# Patient Record
Sex: Female | Born: 1960 | Race: Black or African American | Hispanic: No | Marital: Single | Smoking: Current some day smoker
Health system: Southern US, Community
[De-identification: ages and names within clinical notes are randomized; demographics above are authoritative.]

## PROBLEM LIST (undated history)

## (undated) DIAGNOSIS — J40 Bronchitis, not specified as acute or chronic: Secondary | ICD-10-CM

## (undated) HISTORY — PX: FRACTURE SURGERY: SHX138

---

## 1998-04-06 HISTORY — PX: ANKLE SURGERY: SHX546

## 2003-12-15 ENCOUNTER — Emergency Department (HOSPITAL_COMMUNITY): Admission: EM | Admit: 2003-12-15 | Discharge: 2003-12-15 | Payer: Self-pay | Admitting: Emergency Medicine

## 2009-05-09 ENCOUNTER — Emergency Department: Payer: Self-pay | Admitting: Emergency Medicine

## 2013-04-06 HISTORY — PX: CYST EXCISION: SHX5701

## 2013-11-09 ENCOUNTER — Observation Stay: Payer: Self-pay | Admitting: Obstetrics and Gynecology

## 2013-11-09 LAB — COMPREHENSIVE METABOLIC PANEL
ALBUMIN: 3.4 g/dL (ref 3.4–5.0)
ANION GAP: 10 (ref 7–16)
AST: 10 U/L — AB (ref 15–37)
Alkaline Phosphatase: 53 U/L
BILIRUBIN TOTAL: 0.2 mg/dL (ref 0.2–1.0)
BUN: 7 mg/dL (ref 7–18)
CHLORIDE: 104 mmol/L (ref 98–107)
Calcium, Total: 8.3 mg/dL — ABNORMAL LOW (ref 8.5–10.1)
Co2: 24 mmol/L (ref 21–32)
Creatinine: 1.04 mg/dL (ref 0.60–1.30)
EGFR (Non-African Amer.): >60
GLUCOSE: 117 mg/dL — AB (ref 65–99)
Osmolality: 275 (ref 275–301)
Potassium: 4.1 mmol/L (ref 3.5–5.1)
SGPT (ALT): 13 U/L — ABNORMAL LOW
Sodium: 138 mmol/L (ref 136–145)
TOTAL PROTEIN: 7.7 g/dL (ref 6.4–8.2)

## 2013-11-09 LAB — CBC
HCT: 18.7 % — AB (ref 35.0–47.0)
HCT: 20 % — ABNORMAL LOW (ref 35.0–47.0)
HGB: 4.9 g/dL — CL (ref 12.0–16.0)
HGB: 5.7 g/dL — ABNORMAL LOW (ref 12.0–16.0)
MCH: 15.5 pg — ABNORMAL LOW (ref 26.0–34.0)
MCH: 18.2 pg — ABNORMAL LOW (ref 26.0–34.0)
MCHC: 26 g/dL — ABNORMAL LOW (ref 32.0–36.0)
MCHC: 28.4 g/dL — AB (ref 32.0–36.0)
MCV: 60 fL — AB (ref 80–100)
MCV: 64 fL — ABNORMAL LOW (ref 80–100)
Platelet: 1147 10*3/uL — ABNORMAL HIGH (ref 150–440)
Platelet: 950 10*3/uL — ABNORMAL HIGH (ref 150–440)
RBC: 3.13 10*6/uL — ABNORMAL LOW (ref 3.80–5.20)
RBC: 3.11 10*6/uL — AB (ref 3.80–5.20)
RDW: 31.4 % — AB (ref 11.5–14.5)
RDW: 35 % — ABNORMAL HIGH (ref 11.5–14.5)
WBC: 11.3 10*3/uL — AB (ref 3.6–11.0)
WBC: 11.4 10*3/uL — AB (ref 3.6–11.0)

## 2013-11-09 LAB — URINALYSIS, COMPLETE
BACTERIA: NONE SEEN
BILIRUBIN, UR: NEGATIVE
Glucose,UR: NEGATIVE mg/dL (ref 0–75)
KETONE: NEGATIVE
Nitrite: NEGATIVE
Ph: 5 (ref 4.5–8.0)
RBC,UR: 169 /HPF (ref 0–5)
SPECIFIC GRAVITY: 1.018 (ref 1.003–1.030)
WBC UR: 39 /HPF (ref 0–5)

## 2013-11-09 LAB — PREGNANCY, URINE: Pregnancy Test, Urine: NEGATIVE m[IU]/mL

## 2013-11-09 LAB — TROPONIN I: Troponin-I: <0.02 ng/mL

## 2013-11-10 LAB — CBC WITH DIFFERENTIAL/PLATELET
BASOS ABS: 0.2 10*3/uL — AB (ref 0.0–0.1)
BASOS PCT: 1.4 %
Eosinophil #: 0.5 10*3/uL (ref 0.0–0.7)
Eosinophil %: 3.5 %
HCT: 25.5 % — AB (ref 35.0–47.0)
HGB: 8 g/dL — ABNORMAL LOW (ref 12.0–16.0)
Lymphocyte #: 1.4 10*3/uL (ref 1.0–3.6)
Lymphocyte %: 9.7 %
MCH: 21.5 pg — AB (ref 26.0–34.0)
MCHC: 31.4 g/dL — ABNORMAL LOW (ref 32.0–36.0)
MCV: 68 fL — ABNORMAL LOW (ref 80–100)
MONO ABS: 0.6 x10 3/mm (ref 0.2–0.9)
Monocyte %: 4.4 %
Neutrophil #: 11.9 10*3/uL — ABNORMAL HIGH (ref 1.4–6.5)
Neutrophil %: 81 %
Platelet: 756 10*3/uL — ABNORMAL HIGH (ref 150–440)
RBC: 3.73 10*6/uL — AB (ref 3.80–5.20)
RDW: 33.6 % — ABNORMAL HIGH (ref 11.5–14.5)
WBC: 14.7 10*3/uL — AB (ref 3.6–11.0)

## 2013-11-10 LAB — HEMOGLOBIN: HGB: 8.1 g/dL — ABNORMAL LOW (ref 12.0–16.0)

## 2013-11-10 LAB — HEMATOCRIT: HCT: 26.1 % — ABNORMAL LOW (ref 35.0–47.0)

## 2013-11-11 LAB — DRUG SCREEN, URINE
AMPHETAMINES, UR SCREEN: NEGATIVE (ref ?–1000)
Barbiturates, Ur Screen: NEGATIVE (ref ?–200)
Benzodiazepine, Ur Scrn: NEGATIVE (ref ?–200)
COCAINE METABOLITE, UR ~~LOC~~: NEGATIVE (ref ?–300)
Cannabinoid 50 Ng, Ur ~~LOC~~: NEGATIVE (ref ?–50)
MDMA (Ecstasy)Ur Screen: NEGATIVE (ref ?–500)
Methadone, Ur Screen: NEGATIVE (ref ?–300)
Opiate, Ur Screen: POSITIVE (ref ?–300)
Phencyclidine (PCP) Ur S: NEGATIVE (ref ?–25)
Tricyclic, Ur Screen: NEGATIVE (ref ?–1000)

## 2013-11-11 LAB — CBC WITH DIFFERENTIAL/PLATELET
BASOS PCT: 1.5 %
Basophil #: 0.2 10*3/uL — ABNORMAL HIGH (ref 0.0–0.1)
EOS ABS: 0.6 10*3/uL (ref 0.0–0.7)
Eosinophil %: 4.3 %
HCT: 25.9 % — ABNORMAL LOW (ref 35.0–47.0)
HGB: 8.2 g/dL — ABNORMAL LOW (ref 12.0–16.0)
LYMPHS ABS: 2.3 10*3/uL (ref 1.0–3.6)
Lymphocyte %: 16.2 %
MCH: 21.5 pg — AB (ref 26.0–34.0)
MCHC: 31.7 g/dL — ABNORMAL LOW (ref 32.0–36.0)
MCV: 68 fL — ABNORMAL LOW (ref 80–100)
MONO ABS: 0.6 x10 3/mm (ref 0.2–0.9)
MONOS PCT: 4.5 %
NEUTROS ABS: 10.6 10*3/uL — AB (ref 1.4–6.5)
NEUTROS PCT: 73.5 %
Platelet: 735 10*3/uL — ABNORMAL HIGH (ref 150–440)
RBC: 3.8 10*6/uL (ref 3.80–5.20)
RDW: 34.9 % — AB (ref 11.5–14.5)
WBC: 14.4 10*3/uL — AB (ref 3.6–11.0)

## 2013-11-14 LAB — PATHOLOGY REPORT

## 2014-06-11 ENCOUNTER — Ambulatory Visit: Payer: Self-pay | Admitting: Physician Assistant

## 2014-07-28 NOTE — Op Note (Signed)
PATIENT NAME:  Cindy Hicks, Cindy Hicks MR#:  [id number] DATE OF BIRTH:  [birth year]  DATE OF PROCEDURE:  11/11/2013  PREOPERATIVE DIAGNOSES: 1. Prolapsing submucosal leiomyoma.  2. Menorrhagia to anemia.   POSTOPERATIVE DIAGNOSES: 1. Prolapsing submucosal leiomyoma.  2. Menorrhagia to anemia.   OPERATION PERFORMED: Vaginal myomectomy.   ANESTHESIA: General.   PRIMARY SURGEON: Stoney Bang. Georgianne Fick, M.D.   ASSISTANT: Will Bonnet, M.D.    ESTIMATED BLOOD LOSS: Minimal.   OPERATIVE FLUIDS: 4 mL of crystalloid.   COMPLICATIONS: None.   FINDINGS: Approximately 3 to 4 cm smooth submucosal leiomyoma prolapsing through the cervix on a thin stalk.   SPECIMENS REMOVED: Submucosal leiomyoma.   PATIENT CONDITION FOLLOWING PROCEDURE: Stable.   PREOPERATIVE ANTIBIOTICS: None.   DRAINS OR TUBES: None.   IMPLANTS: None.   PROCEDURE IN DETAIL: The risks, benefits, and alternatives of the procedure were discussed with the patient prior to proceeding to the operating room. The patient was taken to the operating room where she was placed under general endotracheal anesthesia. She was positioned in the dorsal lithotomy position, prepped and draped in the usual sterile fashion. A timeout was performed.   Attention was turned to the patient's bladder. The bladder was straight catheterized with a red rubber catheter. An operative speculum was then placed, visualizing the cervix and the prolapsing leiomyoma. The leiomyoma was grasped with a double-tooth tenaculum. Two Endoloops of polydioxanone suture were then passed over the myoma to tie off the stalk. A Heaney clamp was then placed across the stalk and the myoma was excised using Mayo scissors. Following excision of the myoma, the cervix was inspected and noted to be hemostatic with no bleeding noted from the cervical os or site of dissection.   Sponge, needle, and instrument counts were correct x 2.   The patient tolerated the procedure well and  was taken to the recovery room in stable condition.     ____________________________ Stoney Bang. Georgianne Fick, MD ams:JT D: 11/16/2013 11:34:37 ET T: 11/16/2013 12:17:46 ET JOB#: [id number]  cc: Stoney Bang. Georgianne Fick, MD, <Dictator> Conan Bowens Madelon Lips MD ELECTRONICALLY SIGNED 11/27/2013 15:56

## 2014-07-28 NOTE — Consult Note (Signed)
PATIENT NAME:  Cindy Hicks, Cindy Hicks MR#:  [id number] DATE OF BIRTH:  [birth year]  DATE OF CONSULTATION:  11/09/2013  REQUESTING PHYSICIAN:  Brunilda Payor A. Edd Fabian, [place] PHYSICIAN:  Will Bonnet, MD  REASON FOR CONSULTATION:  Anemia due to heavy vaginal bleeding.   HISTORY OF PRESENT ILLNESS: Cindy Hicks is a 54 year old gravida 0 female who presented to acute care today after having 3 weeks of heavy vaginal bleeding with worsening symptoms of weakness and feeling tired. She states that her menses started approximately 1 month ago and usually the 1st 3 or 4 days are very bad, where she cannot leave the house due to having to change her pad every 28-30 minutes.  She has had abnormal periods for the past 6 months, and though they come at regular intervals, her bleeding is very heavy and lasting about 12 days.  Intercourse seems to make the bleeding worse. She occasionally does have dyspareunia with intercourse. She also has to wear a tampon before each period due to a large amount of clear discharge. She notes that she will occasionally get short of breath with exertion and she also notices insomnia. She denies chest pain, trouble breathing, abdominal pain or abdominal bloating, change in her weight.  The patient's last Pap smear was approximately 3 years ago, which she notes that it was probably abnormal, but she does not remember exactly what the result was.   PAST MEDICAL HISTORY: Chronic bronchitis.   PAST SURGICAL HISTORY: Ankle surgery on her left ankle for and ankle fracture.   CURRENT MEDICATIONS: None.   ALLERGIES: No known drug allergies.   OB/GYN HISTORY: Menarche at age 77 years old with periods as above. She has never been pregnant.   SOCIAL HISTORY: She states that she smokes approximately 5 cigarettes per day. She drinks approximately 6 crantinis a day, which is a vodka containing concoction that she makes.  She also occasionally will smoke marijuana.  FAMILY HISTORY:   Notable for diabetes. She denies any history of cancer in her family.   REVIEW OF SYSTEMS:  Normal x10 systems reviewed unless otherwise noted in the HPI.   PHYSICAL EXAMINATION: VITAL SIGNS: Temperature 98.2 degrees Fahrenheit, pulse 92, respiratory rate 16, blood pressure 126/66, oxygen saturation 97% on room air.  GENERAL: Obese female who is in no apparent distress.  NECK: No lymphadenopathy. Trachea is midline.  No thyromegaly.  CARDIOVASCULAR: Regular rate and rhythm with a 2/6 systolic ejection murmur.  LUNGS:  Clear to auscultation bilaterally.  ABDOMEN: Obese soft, nontender, nondistended. No masses palpated. No rebound.  No guarding.  PELVIC:  The patient has normal external female genitalia, normal Bartholin's urethra and Skene's glands. The vagina is pink and normally rugated. The cervix and is not visible due to approximately 4 cm of prolapsing fibroid which is well-circumscribed. There is scant dark blood in the vaginal vault. Bimanual exam confirms the presence of a prolapsing mass coming from the cervix.  The cervix, itself, is very difficult to palpate. This is nontender to the patient. There are no obvious uterine abnormalities or masses palpated.  No adnexal fullness or tenderness.  However, exam is a very limited given the patient's habitus.  EXTREMITIES: No erythema, cords or tenderness.  SKIN: No lesions noted. No petechiae, no ecchymoses.  NEUROLOGIC: Cranial nerves are intact grossly.  LABORATORY DATA:  Pertinents only:  Hemoglobin 4.9, hematocrit is 18.7, white blood cell count is 11.3. Platelet count is 1147. MCV is 60. Serum glucose is 117, creatinine 1.04, potassium  4.1. GFR greater than 60. LFTs normal.  Pregnancy test is negative.   IMAGING: Pelvic ultrasound revealed uterus measuring 8.6 x 5.0 x 5.1 cm. The urine fibroids noted appearing peripheral/subserosal. At least 2 uterine fibroids demonstrated.  Fundal fibroid is 3.7 x 2.7 x 3.7 cm. The 2nd fibroid is  approximately 2.6 x 2.4 x 2.5 cm. Endometrium 2.6 mm in thickness with no focal abnormality visualized. Right ovary measures 3.1 x 3.0 x 2.7 cm with a small minimally complex hypoechoic cyst noted that is measuring 2.2 x 1.5 x 1.3 cm, possibly representing a hemorrhagic cyst.  Left ovary measuring 2.1 x 1.2 x 1.4 cm with no appearance and no adnexal mass. No free fluid noted in the cul-de-sac.  This is all per radiology report.   ASSESSMENT AND RECOMMENDATIONS: This is a 54 year old G38 female who is profoundly anemic due to vaginal bleeding, most likely due to her prolapsing fibroid, appears stable now. The patient has received 1 unit of packed red blood cells and has normal vitals  at this time and no symptoms. She is very concerned regarding her continued bleeding.   PLAN:   1.  Admit for observation and transfusion of additional blood products as necessary.  2.  For her heavy vaginal bleeding, will give her Provera 20 mg tablets 3 times daily per the ACOG practice bulletin recommendation.  3.  Will keep n.p.o. for now with only taking the above-noted medications in case she does need to go to the OR.  4.  Thrombocytosis. Will obtain a hematology consult while the patient is in-house to see if this relates at all to any bleeding diathesis the patient may have.  5.  Plan for stabilizing bleeding and discharge tomorrow with early followup and plan for surgical removal of fibroid.   Thank you for this consultation. Appreciate the opportunity to participate in the care of your patient.    ____________________________ Will Bonnet, MD sdj:ds D: 11/09/2013 19:22:57 ET T: 11/09/2013 20:01:44 ET JOB#: [id number]  cc: Will Bonnet, MD, <Dictator> Will Bonnet MD ELECTRONICALLY SIGNED 12/20/2013 18:07

## 2014-07-28 NOTE — Consult Note (Signed)
Brief Consult Note: Diagnosis: 1) acute blood loss anemia due to menstrual blood loss, 2) fibroid of cervix, 3) fibroid uterus, 4) thrombocytosis.   Patient was seen by consultant.   Consult note dictated.   Recommend further assessment or treatment.   Orders entered.   Discussed with Attending MD.   Comments: -Will admit for observation and medication administration to help with bleeding -if needs to go to OR, will remove cervical fibroid, but would like to perform under more controlled circumstances with more normal blood counts on outpatient basis. - hematology consult for thrombocytosis.  Electronic Signatures: Will Bonnet (MD)  (Signed 06-Aug-15 18:31)  Authored: Brief Consult Note   Last Updated: 06-Aug-15 18:31 by Will Bonnet (MD)

## 2015-09-10 ENCOUNTER — Emergency Department
Admission: EM | Admit: 2015-09-10 | Discharge: 2015-09-11 | Disposition: A | Discharge status: Home / Self Care | Payer: 59 | Attending: Emergency Medicine | Admitting: Emergency Medicine

## 2015-09-10 ENCOUNTER — Emergency Department: Payer: 59

## 2015-09-10 ENCOUNTER — Encounter: Payer: Self-pay | Admitting: Emergency Medicine

## 2015-09-10 DIAGNOSIS — Y939 Activity, unspecified: Secondary | ICD-10-CM | POA: Insufficient documentation

## 2015-09-10 DIAGNOSIS — W010XXA Fall on same level from slipping, tripping and stumbling without subsequent striking against object, initial encounter: Secondary | ICD-10-CM | POA: Diagnosis not present

## 2015-09-10 DIAGNOSIS — Y92009 Unspecified place in unspecified non-institutional (private) residence as the place of occurrence of the external cause: Secondary | ICD-10-CM | POA: Insufficient documentation

## 2015-09-10 DIAGNOSIS — S8991XA Unspecified injury of right lower leg, initial encounter: Secondary | ICD-10-CM | POA: Diagnosis present

## 2015-09-10 DIAGNOSIS — Z87891 Personal history of nicotine dependence: Secondary | ICD-10-CM | POA: Diagnosis not present

## 2015-09-10 DIAGNOSIS — S8261XA Displaced fracture of lateral malleolus of right fibula, initial encounter for closed fracture: Secondary | ICD-10-CM | POA: Diagnosis not present

## 2015-09-10 DIAGNOSIS — S82402A Unspecified fracture of shaft of left fibula, initial encounter for closed fracture: Secondary | ICD-10-CM

## 2015-09-10 DIAGNOSIS — S8251XA Displaced fracture of medial malleolus of right tibia, initial encounter for closed fracture: Secondary | ICD-10-CM | POA: Diagnosis not present

## 2015-09-10 DIAGNOSIS — Y999 Unspecified external cause status: Secondary | ICD-10-CM | POA: Diagnosis not present

## 2015-09-10 DIAGNOSIS — S82202A Unspecified fracture of shaft of left tibia, initial encounter for closed fracture: Secondary | ICD-10-CM

## 2015-09-10 MED ORDER — MORPHINE SULFATE (PF) 4 MG/ML IV SOLN
4.0000 mg | Freq: Once | INTRAVENOUS | Status: AC
  Administered 2015-09-11: 4 mg via INTRAVENOUS
  Filled 2015-09-10: qty 1

## 2015-09-10 MED ORDER — ONDANSETRON HCL 4 MG/2ML IJ SOLN
4.0000 mg | Freq: Once | INTRAMUSCULAR | Status: AC
  Administered 2015-09-11: 4 mg via INTRAVENOUS
  Filled 2015-09-10: qty 2

## 2015-09-10 NOTE — ED Notes (Signed)
Pt arrived by EMS from home, post fall with c/o right ankle pain. EMS reports pt fell coming out of house, pt fell back sitting on the right ankle when landing. Swelling noted to right ankle on arrival to ED.

## 2015-09-11 ENCOUNTER — Encounter: Payer: Self-pay | Admitting: Emergency Medicine

## 2015-09-11 DIAGNOSIS — S8251XA Displaced fracture of medial malleolus of right tibia, initial encounter for closed fracture: Secondary | ICD-10-CM | POA: Diagnosis not present

## 2015-09-11 MED ORDER — OXYCODONE-ACETAMINOPHEN 5-325 MG PO TABS: 1.0000 | ORAL_TABLET | Freq: Four times a day (QID) | ORAL | Status: DC | PRN

## 2015-09-11 MED ORDER — MORPHINE SULFATE (PF) 4 MG/ML IV SOLN
4.0000 mg | Freq: Once | INTRAVENOUS | Status: AC
  Administered 2015-09-11: 4 mg via INTRAVENOUS
  Filled 2015-09-11: qty 1

## 2015-09-11 MED ORDER — HYDROMORPHONE HCL 1 MG/ML IJ SOLN
1.0000 mg | Freq: Once | INTRAMUSCULAR | Status: AC
  Administered 2015-09-11: 1 mg via INTRAVENOUS
  Filled 2015-09-11: qty 1

## 2015-09-11 MED ORDER — OXYCODONE-ACETAMINOPHEN 5-325 MG PO TABS
1.0000 | ORAL_TABLET | Freq: Once | ORAL | Status: AC
  Administered 2015-09-11: 1 via ORAL
  Filled 2015-09-11: qty 1

## 2015-09-11 NOTE — Discharge Instructions (Signed)
Tibial and Fibular Fracture, Adult °Tibial and fibular fracture is a break in the bones of your lower leg (tibia and fibula). The tibia is the larger of these two bones. The fibula is the smaller of the two bones. It is on the outer side of your leg.  °CAUSES °· Low-energy injuries, such as a fall from ground level. °· High-energy injuries, such as motor vehicle injuries, gunshot wounds, or high-speed sports collisions. °RISK FACTORS °· Jumping activities. °· Repetitive stress, such as long-distance running. °· Participation in sports. °· Osteoporosis. °· Advanced age. °SIGNS AND SYMPTOMS °· Pain. °· Swelling. °· Inability to put weight on your injured leg. °· Bone deformities at the site of your injury. °· Bruising. °DIAGNOSIS  °Tibial and fibular fractures are diagnosed with the use of X-ray exams. °TREATMENT  °If you have a simple fracture of these two bones, they can be treated with simple immobilization. A cast or splint will be used on your leg to keep it from moving while it heals. Then you can begin range-of-motion exercises to regain your knee motion. °HOME CARE INSTRUCTIONS  °· Apply ice to your leg: °¨ Put ice in a plastic bag. °¨ Place a towel between your skin and the bag. °¨ Leave the ice on for 20 minutes, 2-3 times a day. °· If you have a plaster or fiberglass cast: °¨ Do not try to scratch the skin under the cast using sharp or pointed objects. °¨ Check the skin around the cast every day. You may put lotion on any red or sore areas. °¨ Keep your cast dry and clean. °· If you have a plaster splint: °¨ Wear the splint as directed. °¨ You may loosen the elastic around the splint if your toes become numb, tingle, or turn cold or blue. °· Do not put pressure on any part of your cast or splint until it is fully hardened, because it may deform. °· Your cast or splint can be protected during bathing with a plastic bag. Do not lower the cast or splint into water. °· Use crutches as directed. °· Only take  over-the-counter or prescription medicines for pain, discomfort, or fever as directed by your health care provider. °· Follow all instructions given to you by your health care provider. °· Make and keep all follow-up appointments. °SEEK MEDICAL CARE IF: °· Your pain is becoming worse rather than better or is not controlled with medicines. °· You have increased swelling or redness in the foot. °· You begin to lose feeling in your foot or toes. °SEEK IMMEDIATE MEDICAL CARE IF: °· You develop a cold or blue foot or toes on the injured side. °· You develop severe pain in your injured leg, especially if the pain is increased with movement of your toes. °MAKE SURE YOU: °· Understand these instructions. °· Will watch your condition. °· Will get help right away if you are not doing well or get worse. °  °This information is not intended to replace advice given to you by your health care provider. Make sure you discuss any questions you have with your health care provider. °  °Document Released: 12/13/2001 Document Revised: 08/07/2014 Document Reviewed: 11/02/2012 °Elsevier Interactive Patient Education ©2016 [employer] ° °

## 2015-09-11 NOTE — ED Provider Notes (Signed)
[facility] Emergency Department Provider Note   ____________________________________________  Time seen: Approximately 2352 PM  I have reviewed the triage vital signs and the nursing notes.   HISTORY  Chief Complaint Ankle Pain    HPI Cindy Hicks is a 55 y.o. female who comes into the hospital today with an ankle injury. The patient reports that she fell out of the back door. She stumbled and slipped back and sat on her right foot. The patient denies any head injury or any loss of consciousness. She reports that she's in 10 out of 10 pain. She reports that she can move her toes but she did not put ice on it or do anything. She simply called back into the house at on the couch and then EMS was called. She reports that she's had left ankle surgery and 2000 done by Dr. Jefm Bryant but she has not injured this ankle in the past.The patient is here for evaluation of her ankle pain.   History reviewed. No pertinent past medical history.  There are no active problems to display for this patient.   Past Surgical History  Procedure Laterality Date  . Ankle surgery      Current Outpatient Rx  Name  Route  Sig  Dispense  Refill  . oxyCODONE-acetaminophen (ROXICET) 5-325 MG tablet   Oral   Take 1 tablet by mouth every 6 (six) hours as needed.   12 tablet   0     Allergies Review of patient's allergies indicates no known allergies.  History reviewed. No pertinent family history.  Social History Social History  Substance Use Topics  . Smoking status: Former Smoker    Types: Cigarettes  . Smokeless tobacco: None  . Alcohol Use: Yes    Review of Systems Constitutional: No fever/chills Eyes: No visual changes. ENT: No sore throat. Cardiovascular: Denies chest pain. Respiratory: Denies shortness of breath. Gastrointestinal: No abdominal pain.  No nausea, no vomiting.  No diarrhea.  No constipation. Genitourinary: Negative for  dysuria. Musculoskeletal: right ankle pain Skin: Negative for rash. Neurological: Negative for headaches, focal weakness or numbness.  10-point ROS otherwise negative.  ____________________________________________   PHYSICAL EXAM:  VITAL SIGNS: ED Triage Vitals  Enc Vitals Group     BP 09/10/15 2320 168/95 mmHg     Pulse Rate 09/10/15 2320 85     Resp 09/10/15 2320 15     Temp 09/10/15 2320 98.1 F (36.7 C)     Temp Source 09/10/15 2320 Oral     SpO2 09/10/15 2319 99 %     Weight 09/10/15 2320 200 lb (90.719 kg)     Height 09/10/15 2320 5\' 3"  (1.6 m)     Head Cir --      Peak Flow --      Pain Score 09/10/15 2321 10     Pain Loc --      Pain Edu? --      Excl. in [place]? --     Constitutional: Alert and oriented. Well appearing and in moderate distress. Eyes: Conjunctivae are normal. PERRL. EOMI. Head: Atraumatic. Nose: No congestion/rhinnorhea. Mouth/Throat: Mucous membranes are moist.  Oropharynx non-erythematous. Cardiovascular: Normal rate, regular rhythm. Grossly normal heart sounds.  Good peripheral circulation. Respiratory: Normal respiratory effort.  No retractions. Lungs CTAB. Gastrointestinal: Soft and nontender. No distention. Positive bowel sounds Musculoskeletal: right ankle swelling and tenderness to palpation.  Neurologic:  Normal speech and language. No gross focal neurologic deficits are appreciated. No gait instability.  Skin:  Skin is warm, dry and intact.  Psychiatric: Mood and affect are normal.   ____________________________________________   LABS (all labs ordered are listed, but only abnormal results are displayed)  Labs Reviewed - No data to display ____________________________________________  EKG  none ____________________________________________  RADIOLOGY  Ankle Complete right: Fractures of the distal fibula and medial malleolus. A fracture fragment along the posterior aspect of distal tibia on the lateral projection likely  represents a fracture of the posterior malleolus. Ct may provide better evaluation of the ankle ____________________________________________   PROCEDURES  Procedure(s) performed: please, see procedure note(s).   SPLINT APPLICATION Date/Time: 123XX123 AM Authorized by: Charlesetta Ivory P Consent: Verbal consent obtained. Risks and benefits: risks, benefits and alternatives were discussed Consent given by: patient Splint applied by: ED technician Location details: right ankle Splint type: posterior and sugar tong Supplies used: orthoglass Post-procedure: The splinted body part was neurovascularly unchanged following the procedure. Patient tolerance: Patient tolerated the procedure well with no immediate complications.    Critical Care performed: No  ____________________________________________   INITIAL IMPRESSION / ASSESSMENT AND PLAN / ED COURSE  Pertinent labs & imaging results that were available during my care of the patient were reviewed by me and considered in my medical decision making (see chart for details).  This is a 55 year old female who comes into the hospital with an ankle injury. It appears that the patient has a bimalleolar or try mallear fracture. I contacted Dr.Krasinski who looked at the images and felt that the patient could be casted in the office. The patient received 2 doses of morphine and was still having some pains so I did give her a milligram of Dilaudid and the pain improved. I also gave the patient a dose of Percocet. I discussed the results of her x-ray as well as my conversation with the orthopedic surgeon. The patient will be discharged and he has informed her to contact the office this morning so that she can come in and have her fractures casted. The patient will be discharged home to follow-up with orthopedic surgery. ____________________________________________   FINAL CLINICAL IMPRESSION(S) / ED DIAGNOSES  Final diagnoses:  Fracture, tibia and  fibula, left, closed, initial encounter      NEW MEDICATIONS STARTED DURING THIS VISIT:  Discharge Medication List as of 09/11/2015  3:03 AM    START taking these medications   Details  oxyCODONE-acetaminophen (ROXICET) 5-325 MG tablet Take 1 tablet by mouth every 6 (six) hours as needed., Starting 09/11/2015, Until Discontinued, Print         Note:  This document was prepared using Dragon voice recognition software and may include unintentional dictation errors.    Loney Hering, MD 09/11/15 0800

## 2015-09-13 ENCOUNTER — Other Ambulatory Visit: Payer: Self-pay | Admitting: Orthopedic Surgery

## 2015-09-17 ENCOUNTER — Encounter
Admission: RE | Admit: 2015-09-17 | Discharge: 2015-09-17 | Disposition: A | Discharge status: Home / Self Care | Payer: No Typology Code available for payment source | Source: Ambulatory Visit | Attending: Orthopedic Surgery | Admitting: Orthopedic Surgery

## 2015-09-17 DIAGNOSIS — Z01812 Encounter for preprocedural laboratory examination: Secondary | ICD-10-CM | POA: Insufficient documentation

## 2015-09-17 HISTORY — DX: Bronchitis, not specified as acute or chronic: J40

## 2015-09-17 LAB — CBC WITH DIFFERENTIAL/PLATELET
BASOS PCT: 1 %
Basophils Absolute: 0 10*3/uL (ref 0–0.1)
EOS ABS: 0.3 10*3/uL (ref 0–0.7)
EOS PCT: 7 %
HCT: 44 % (ref 35.0–47.0)
Hemoglobin: 14.4 g/dL (ref 12.0–16.0)
LYMPHS ABS: 1.1 10*3/uL (ref 1.0–3.6)
Lymphocytes Relative: 22 %
MCH: 28.1 pg (ref 26.0–34.0)
MCHC: 32.6 g/dL (ref 32.0–36.0)
MCV: 86.3 fL (ref 80.0–100.0)
MONO ABS: 0.3 10*3/uL (ref 0.2–0.9)
MONOS PCT: 7 %
NEUTROS PCT: 63 %
Neutro Abs: 3.2 10*3/uL (ref 1.4–6.5)
Platelets: 309 10*3/uL (ref 150–440)
RBC: 5.1 MIL/uL (ref 3.80–5.20)
RDW: 14.4 % (ref 11.5–14.5)
WBC: 5 10*3/uL (ref 3.6–11.0)

## 2015-09-17 LAB — BASIC METABOLIC PANEL
Anion gap: 10 (ref 5–15)
BUN: 15 mg/dL (ref 6–20)
CALCIUM: 9.5 mg/dL (ref 8.9–10.3)
CHLORIDE: 97 mmol/L — AB (ref 101–111)
CO2: 27 mmol/L (ref 22–32)
CREATININE: 0.9 mg/dL (ref 0.44–1.00)
GFR calc Af Amer: >60 mL/min (ref >60)
GFR calc non Af Amer: >60 mL/min (ref >60)
GLUCOSE: 100 mg/dL — AB (ref 65–99)
Potassium: 4 mmol/L (ref 3.5–5.1)
Sodium: 134 mmol/L — ABNORMAL LOW (ref 135–145)

## 2015-09-17 LAB — PROTIME-INR
INR: 1.03
Prothrombin Time: 13.7 seconds (ref 11.4–15.0)

## 2015-09-17 LAB — APTT: aPTT: 36 seconds (ref 24–36)

## 2015-09-17 NOTE — Patient Instructions (Signed)
  Your procedure is scheduled [id number] 14, 2017 (Wednesday) Report to Day Surgery.SECOND FLOOR  MEDICAL [facility]ALL To find out your arrival time please call [phone] between 1PM - 3PM on ARRIVAL TIME 12:00 Noon.  Remember: Instructions that are not followed completely may result in serious medical risk, up to and including death, or upon the discretion of your surgeon and anesthesiologist your surgery may need to be rescheduled.    _x___ 1. Do not eat food or drink liquids after midnight. No gum chewing or hard candies.     _x__ 2. No Alcohol for 24 hours before or after surgery.   _x___ 3. Do Not Smoke For 24 Hours Prior to Your Surgery.   ____ 4. Bring all medications with you on the day of surgery if instructed.    __x__ 5. Notify your doctor if there is any change in your medical condition     (cold, fever, infections).       Do not wear jewelry, make-up, hairpins, clips or nail polish.  Do not wear lotions, powders, or perfumes. You may wear deodorant.  Do not shave 48 hours prior to surgery. Men may shave face and neck.  Do not bring valuables to the hospital.    [facility] is not responsible for any belongings or valuables.               Contacts, dentures or bridgework may not be worn into surgery.  Leave your suitcase in the car. After surgery it may be brought to your room.  For patients admitted to the hospital, discharge time is determined by your                treatment team.   Patients discharged the day of surgery will not be allowed to drive home.   Please read over the following fact sheets that you were given:   Surgical Site Infection Prevention   ____ Take these medicines the morning of surgery with A SIP OF WATER:    1.   2.   3.   4.  5.  6.  ____ Fleet Enema (as directed)   __x__ Use CHG Soap as directed  (SAGE WIPES)  ____ Use inhalers on the day of surgery  ____ Stop metformin 2 days prior to surgery    ____ Take 1/2 of usual insulin dose  the night before surgery and none on the morning of surgery.   _x___ Stop Coumadin/Plavix/aspirin on (N/A)  _x_ Stop Anti-inflammatories on (NO NSAIDS)   ____ Stop supplements until after surgery.    ____ Bring C-Pap to the hospital.

## 2015-09-18 ENCOUNTER — Ambulatory Visit: Payer: No Typology Code available for payment source | Admitting: Certified Registered Nurse Anesthetist

## 2015-09-18 ENCOUNTER — Encounter: Payer: Self-pay | Admitting: *Deleted

## 2015-09-18 ENCOUNTER — Ambulatory Visit: Payer: No Typology Code available for payment source

## 2015-09-18 ENCOUNTER — Encounter
Admission: RE | Disposition: A | Discharge status: Home / Self Care | Payer: Self-pay | Source: Ambulatory Visit | Attending: Orthopedic Surgery

## 2015-09-18 ENCOUNTER — Observation Stay
Admission: RE | Admit: 2015-09-18 | Discharge: 2015-09-19 | Disposition: A | Discharge status: Home / Self Care | Payer: No Typology Code available for payment source | Source: Ambulatory Visit | Attending: Orthopedic Surgery | Admitting: Orthopedic Surgery

## 2015-09-18 DIAGNOSIS — Y939 Activity, unspecified: Secondary | ICD-10-CM | POA: Insufficient documentation

## 2015-09-18 DIAGNOSIS — Z885 Allergy status to narcotic agent status: Secondary | ICD-10-CM | POA: Diagnosis not present

## 2015-09-18 DIAGNOSIS — Y92008 Other place in unspecified non-institutional (private) residence as the place of occurrence of the external cause: Secondary | ICD-10-CM | POA: Diagnosis not present

## 2015-09-18 DIAGNOSIS — S82852A Displaced trimalleolar fracture of left lower leg, initial encounter for closed fracture: Principal | ICD-10-CM | POA: Insufficient documentation

## 2015-09-18 DIAGNOSIS — S82851A Displaced trimalleolar fracture of right lower leg, initial encounter for closed fracture: Secondary | ICD-10-CM | POA: Diagnosis present

## 2015-09-18 DIAGNOSIS — S82853A Displaced trimalleolar fracture of unspecified lower leg, initial encounter for closed fracture: Secondary | ICD-10-CM | POA: Diagnosis present

## 2015-09-18 DIAGNOSIS — F17210 Nicotine dependence, cigarettes, uncomplicated: Secondary | ICD-10-CM | POA: Insufficient documentation

## 2015-09-18 DIAGNOSIS — W19XXXA Unspecified fall, initial encounter: Secondary | ICD-10-CM | POA: Diagnosis not present

## 2015-09-18 DIAGNOSIS — Z79899 Other long term (current) drug therapy: Secondary | ICD-10-CM | POA: Insufficient documentation

## 2015-09-18 DIAGNOSIS — R531 Weakness: Secondary | ICD-10-CM | POA: Diagnosis not present

## 2015-09-18 DIAGNOSIS — Z7982 Long term (current) use of aspirin: Secondary | ICD-10-CM | POA: Insufficient documentation

## 2015-09-18 DIAGNOSIS — R262 Difficulty in walking, not elsewhere classified: Secondary | ICD-10-CM | POA: Insufficient documentation

## 2015-09-18 DIAGNOSIS — M25571 Pain in right ankle and joints of right foot: Secondary | ICD-10-CM | POA: Insufficient documentation

## 2015-09-18 DIAGNOSIS — R269 Unspecified abnormalities of gait and mobility: Secondary | ICD-10-CM | POA: Diagnosis not present

## 2015-09-18 HISTORY — PX: ORIF ANKLE FRACTURE: SHX5408

## 2015-09-18 LAB — CBC
HCT: 42.1 % (ref 35.0–47.0)
Hemoglobin: 13.6 g/dL (ref 12.0–16.0)
MCH: 28.3 pg (ref 26.0–34.0)
MCHC: 32.2 g/dL (ref 32.0–36.0)
MCV: 88 fL (ref 80.0–100.0)
PLATELETS: 288 10*3/uL (ref 150–440)
RBC: 4.78 MIL/uL (ref 3.80–5.20)
RDW: 14.3 % (ref 11.5–14.5)
WBC: 11.6 10*3/uL — AB (ref 3.6–11.0)

## 2015-09-18 LAB — CREATININE, SERUM
CREATININE: 1.04 mg/dL — AB (ref 0.44–1.00)
GFR calc non Af Amer: >60 mL/min (ref >60)

## 2015-09-18 LAB — POCT PREGNANCY, URINE: Preg Test, Ur: NEGATIVE

## 2015-09-18 SURGERY — OPEN REDUCTION INTERNAL FIXATION (ORIF) ANKLE FRACTURE
Anesthesia: General | Site: Ankle | Laterality: Right | Wound class: Clean

## 2015-09-18 MED ORDER — ONDANSETRON HCL 4 MG/2ML IJ SOLN
4.0000 mg | Freq: Four times a day (QID) | INTRAMUSCULAR | Status: DC | PRN
  Administered 2015-09-18: 4 mg via INTRAVENOUS
  Filled 2015-09-18 (×2): qty 2

## 2015-09-18 MED ORDER — KETOROLAC TROMETHAMINE 15 MG/ML IJ SOLN
15.0000 mg | Freq: Four times a day (QID) | INTRAMUSCULAR | Status: DC
  Administered 2015-09-18 – 2015-09-19 (×3): 15 mg via INTRAVENOUS
  Filled 2015-09-18 (×11): qty 1

## 2015-09-18 MED ORDER — NEOMYCIN-POLYMYXIN B GU 40-200000 IR SOLN
Status: AC
  Filled 2015-09-18: qty 2

## 2015-09-18 MED ORDER — ACETAMINOPHEN 650 MG RE SUPP: 650.0000 mg | Freq: Four times a day (QID) | RECTAL | Status: DC | PRN

## 2015-09-18 MED ORDER — CEFAZOLIN SODIUM-DEXTROSE 2-4 GM/100ML-% IV SOLN
INTRAVENOUS | Status: AC
  Filled 2015-09-18: qty 100

## 2015-09-18 MED ORDER — CEFAZOLIN SODIUM-DEXTROSE 2-4 GM/100ML-% IV SOLN
2.0000 g | INTRAVENOUS | Status: AC
  Administered 2015-09-18: 2 g via INTRAVENOUS

## 2015-09-18 MED ORDER — ACETAMINOPHEN 500 MG PO TABS
ORAL_TABLET | ORAL | Status: AC
  Filled 2015-09-18: qty 2

## 2015-09-18 MED ORDER — ACETAMINOPHEN 500 MG PO TABS
1000.0000 mg | ORAL_TABLET | ORAL | Status: AC
  Administered 2015-09-18: 1000 mg via ORAL

## 2015-09-18 MED ORDER — DIPHENHYDRAMINE HCL 25 MG PO CAPS
25.0000 mg | ORAL_CAPSULE | Freq: Four times a day (QID) | ORAL | Status: DC | PRN
  Administered 2015-09-19 (×2): 25 mg via ORAL
  Filled 2015-09-18 (×3): qty 1

## 2015-09-18 MED ORDER — FERROUS SULFATE 325 (65 FE) MG PO TABS
325.0000 mg | ORAL_TABLET | Freq: Three times a day (TID) | ORAL | Status: DC
  Administered 2015-09-19: 325 mg via ORAL
  Filled 2015-09-18: qty 1

## 2015-09-18 MED ORDER — HYDROMORPHONE HCL 1 MG/ML IJ SOLN
INTRAMUSCULAR | Status: AC
  Administered 2015-09-18: 0.5 mg via INTRAVENOUS
  Filled 2015-09-18: qty 1

## 2015-09-18 MED ORDER — DIPHENHYDRAMINE HCL 50 MG/ML IJ SOLN
INTRAMUSCULAR | Status: AC
  Administered 2015-09-18: 25 mg via INTRAMUSCULAR
  Filled 2015-09-18: qty 1

## 2015-09-18 MED ORDER — HYDROMORPHONE HCL 1 MG/ML IJ SOLN
0.5000 mg | INTRAMUSCULAR | Status: DC | PRN
  Administered 2015-09-18 (×3): 0.5 mg via INTRAVENOUS

## 2015-09-18 MED ORDER — ONDANSETRON HCL 4 MG/2ML IJ SOLN
INTRAMUSCULAR | Status: AC
  Filled 2015-09-18: qty 2

## 2015-09-18 MED ORDER — FENTANYL CITRATE (PF) 100 MCG/2ML IJ SOLN
25.0000 ug | INTRAMUSCULAR | Status: DC | PRN
  Administered 2015-09-18 (×3): 50 ug via INTRAVENOUS

## 2015-09-18 MED ORDER — HYDROMORPHONE HCL 1 MG/ML IJ SOLN
1.0000 mg | INTRAMUSCULAR | Status: DC | PRN
  Administered 2015-09-18 (×3): 1 mg via INTRAVENOUS
  Filled 2015-09-18 (×4): qty 1

## 2015-09-18 MED ORDER — NEOMYCIN-POLYMYXIN B GU 40-200000 IR SOLN
Status: DC | PRN
  Administered 2015-09-18: 4 mL

## 2015-09-18 MED ORDER — METHOCARBAMOL 500 MG PO TABS: 500.0000 mg | ORAL_TABLET | Freq: Four times a day (QID) | ORAL | Status: DC | PRN

## 2015-09-18 MED ORDER — MIDAZOLAM HCL 2 MG/2ML IJ SOLN
INTRAMUSCULAR | Status: DC | PRN
  Administered 2015-09-18: 2 mg via INTRAVENOUS

## 2015-09-18 MED ORDER — PROPOFOL 10 MG/ML IV BOLUS
INTRAVENOUS | Status: DC | PRN
  Administered 2015-09-18: 160 mg via INTRAVENOUS

## 2015-09-18 MED ORDER — CHLORHEXIDINE GLUCONATE 4 % EX LIQD: 60.0000 mL | Freq: Once | CUTANEOUS | Status: DC

## 2015-09-18 MED ORDER — LIDOCAINE HCL (CARDIAC) 20 MG/ML IV SOLN
INTRAVENOUS | Status: DC | PRN
  Administered 2015-09-18: 50 mg via INTRAVENOUS

## 2015-09-18 MED ORDER — DIPHENHYDRAMINE HCL 50 MG/ML IJ SOLN
25.0000 mg | Freq: Once | INTRAMUSCULAR | Status: AC
  Administered 2015-09-18: 25 mg via INTRAMUSCULAR

## 2015-09-18 MED ORDER — FENTANYL CITRATE (PF) 100 MCG/2ML IJ SOLN
INTRAMUSCULAR | Status: AC
  Filled 2015-09-18: qty 2

## 2015-09-18 MED ORDER — ALUM & MAG HYDROXIDE-SIMETH 200-200-20 MG/5ML PO SUSP: 30.0000 mL | ORAL | Status: DC | PRN

## 2015-09-18 MED ORDER — FAMOTIDINE 20 MG PO TABS
20.0000 mg | ORAL_TABLET | Freq: Once | ORAL | Status: AC
  Administered 2015-09-18: 20 mg via ORAL

## 2015-09-18 MED ORDER — ACETAMINOPHEN 10 MG/ML IV SOLN
INTRAVENOUS | Status: AC
  Filled 2015-09-18: qty 100

## 2015-09-18 MED ORDER — MAGNESIUM CITRATE PO SOLN: 1.0000 | Freq: Once | ORAL | Status: DC | PRN

## 2015-09-18 MED ORDER — SENNA 8.6 MG PO TABS
1.0000 | ORAL_TABLET | Freq: Two times a day (BID) | ORAL | Status: DC
  Administered 2015-09-18: 8.6 mg via ORAL
  Filled 2015-09-18: qty 1

## 2015-09-18 MED ORDER — BUPIVACAINE HCL (PF) 0.25 % IJ SOLN
INTRAMUSCULAR | Status: AC
  Filled 2015-09-18: qty 30

## 2015-09-18 MED ORDER — LACTATED RINGERS IV SOLN
INTRAVENOUS | Status: DC
  Administered 2015-09-18: 13:00:00 via INTRAVENOUS

## 2015-09-18 MED ORDER — BISACODYL 10 MG RE SUPP: 10.0000 mg | Freq: Every day | RECTAL | Status: DC | PRN

## 2015-09-18 MED ORDER — SODIUM CHLORIDE 0.9 % IV SOLN
75.0000 mL/h | INTRAVENOUS | Status: DC
  Administered 2015-09-18 – 2015-09-19 (×2): 75 mL/h via INTRAVENOUS

## 2015-09-18 MED ORDER — ONDANSETRON HCL 4 MG PO TABS: 4.0000 mg | ORAL_TABLET | Freq: Four times a day (QID) | ORAL | Status: DC | PRN

## 2015-09-18 MED ORDER — KETOROLAC TROMETHAMINE 30 MG/ML IJ SOLN
INTRAMUSCULAR | Status: DC | PRN
  Administered 2015-09-18: 30 mg via INTRAVENOUS

## 2015-09-18 MED ORDER — DOCUSATE SODIUM 100 MG PO CAPS
100.0000 mg | ORAL_CAPSULE | Freq: Two times a day (BID) | ORAL | Status: DC
  Administered 2015-09-18: 100 mg via ORAL
  Filled 2015-09-18: qty 1

## 2015-09-18 MED ORDER — ACETAMINOPHEN 325 MG PO TABS: 650.0000 mg | ORAL_TABLET | Freq: Four times a day (QID) | ORAL | Status: DC | PRN

## 2015-09-18 MED ORDER — ENOXAPARIN SODIUM 40 MG/0.4ML ~~LOC~~ SOLN
40.0000 mg | SUBCUTANEOUS | Status: DC
  Administered 2015-09-19: 40 mg via SUBCUTANEOUS
  Filled 2015-09-18: qty 0.4

## 2015-09-18 MED ORDER — FAMOTIDINE 20 MG PO TABS
ORAL_TABLET | ORAL | Status: AC
  Filled 2015-09-18: qty 1

## 2015-09-18 MED ORDER — CEFAZOLIN SODIUM-DEXTROSE 2-4 GM/100ML-% IV SOLN
2.0000 g | Freq: Four times a day (QID) | INTRAVENOUS | Status: AC
  Administered 2015-09-18 – 2015-09-19 (×2): 2 g via INTRAVENOUS
  Filled 2015-09-18 (×2): qty 100

## 2015-09-18 MED ORDER — FENTANYL CITRATE (PF) 100 MCG/2ML IJ SOLN
INTRAMUSCULAR | Status: AC
  Administered 2015-09-18: 50 ug via INTRAVENOUS
  Filled 2015-09-18: qty 2

## 2015-09-18 MED ORDER — PHENOL 1.4 % MT LIQD: 1.0000 | OROMUCOSAL | Status: DC | PRN

## 2015-09-18 MED ORDER — FENTANYL CITRATE (PF) 100 MCG/2ML IJ SOLN
INTRAMUSCULAR | Status: DC | PRN
  Administered 2015-09-18 (×3): 50 ug via INTRAVENOUS
  Administered 2015-09-18: 25 ug via INTRAVENOUS
  Administered 2015-09-18: 50 ug via INTRAVENOUS
  Administered 2015-09-18: 25 ug via INTRAVENOUS
  Administered 2015-09-18: 50 ug via INTRAVENOUS

## 2015-09-18 MED ORDER — HYDROCODONE-ACETAMINOPHEN 5-325 MG PO TABS
1.0000 | ORAL_TABLET | Freq: Four times a day (QID) | ORAL | Status: DC | PRN
  Administered 2015-09-19 (×3): 1 via ORAL
  Filled 2015-09-18 (×3): qty 1

## 2015-09-18 MED ORDER — PHENYLEPHRINE HCL 10 MG/ML IJ SOLN: 0.0000 ug/min | INTRAVENOUS | Status: DC

## 2015-09-18 MED ORDER — MENTHOL 3 MG MT LOZG: 1.0000 | LOZENGE | OROMUCOSAL | Status: DC | PRN

## 2015-09-18 MED ORDER — KETAMINE HCL 50 MG/ML IJ SOLN
INTRAMUSCULAR | Status: DC | PRN
  Administered 2015-09-18: 50 mg via INTRAMUSCULAR

## 2015-09-18 MED ORDER — POLYETHYLENE GLYCOL 3350 17 G PO PACK: 17.0000 g | PACK | Freq: Every day | ORAL | Status: DC | PRN

## 2015-09-18 MED ORDER — DEXAMETHASONE SODIUM PHOSPHATE 10 MG/ML IJ SOLN
INTRAMUSCULAR | Status: DC | PRN
  Administered 2015-09-18: 10 mg via INTRAVENOUS

## 2015-09-18 MED ORDER — ONDANSETRON HCL 4 MG/2ML IJ SOLN
4.0000 mg | Freq: Once | INTRAMUSCULAR | Status: AC | PRN
  Administered 2015-09-18: 4 mg via INTRAVENOUS

## 2015-09-18 MED ORDER — METHOCARBAMOL 1000 MG/10ML IJ SOLN: 500.0000 mg | Freq: Four times a day (QID) | INTRAVENOUS | Status: DC | PRN

## 2015-09-18 SURGICAL SUPPLY — 55 items
BANDAGE ELASTIC 4 LF NS (GAUZE/BANDAGES/DRESSINGS) ×6 IMPLANT
BIT DRILL 2.5X110 QC LCP DISP (BIT) ×2 IMPLANT
BLADE SURG 15 STRL LF DISP TIS (BLADE) ×1 IMPLANT
BLADE SURG 15 STRL SS (BLADE) ×3
BNDG CMPR MED 5X4 ELC HKLP NS (GAUZE/BANDAGES/DRESSINGS) ×2
BNDG ESMARK 6X12 TAN STRL LF (GAUZE/BANDAGES/DRESSINGS) ×3 IMPLANT
CLOSURE WOUND 1/2 X4 (GAUZE/BANDAGES/DRESSINGS) ×2
CUFF TOURN 24 STER (MISCELLANEOUS) IMPLANT
CUFF TOURN 30 STER DUAL PORT (MISCELLANEOUS) IMPLANT
CUFF TOURN 34 STER (MISCELLANEOUS) IMPLANT
DRAPE FLUOR MINI C-ARM 54X84 (DRAPES) ×3 IMPLANT
DRAPE INCISE IOBAN 66X45 STRL (DRAPES) ×3 IMPLANT
DRAPE U-SHAPE 47X51 STRL (DRAPES) ×3 IMPLANT
DURAPREP 26ML APPLICATOR (WOUND CARE) ×6 IMPLANT
GAUZE PETRO XEROFOAM 1X8 (MISCELLANEOUS) ×3 IMPLANT
GAUZE SPONGE 4X4 12PLY STRL (GAUZE/BANDAGES/DRESSINGS) ×3 IMPLANT
GLOVE BIOGEL PI IND STRL 9 (GLOVE) ×1 IMPLANT
GLOVE BIOGEL PI INDICATOR 9 (GLOVE) ×2
GLOVE SURG 9.0 ORTHO LTXF (GLOVE) ×6 IMPLANT
GOWN STRL REUS TWL 2XL XL LVL4 (GOWN DISPOSABLE) ×3 IMPLANT
GOWN STRL REUS W/ TWL LRG LVL3 (GOWN DISPOSABLE) ×1 IMPLANT
GOWN STRL REUS W/TWL LRG LVL3 (GOWN DISPOSABLE) ×3
GUIDEWIRE THREADED 150MM (WIRE) ×2 IMPLANT
KIT RM TURNOVER STRD PROC AR (KITS) ×3 IMPLANT
LABEL OR SOLS (LABEL) ×3 IMPLANT
NDL FILTER BLUNT 18X1 1/2 (NEEDLE) IMPLANT
NEEDLE FILTER BLUNT 18X 1/2SAF (NEEDLE) ×2
NEEDLE FILTER BLUNT 18X1 1/2 (NEEDLE) ×1 IMPLANT
NS IRRIG 1000ML POUR BTL (IV SOLUTION) ×3 IMPLANT
PACK EXTREMITY ARMC (MISCELLANEOUS) ×3 IMPLANT
PAD ABD DERMACEA PRESS 5X9 (GAUZE/BANDAGES/DRESSINGS) ×6 IMPLANT
PAD CAST CTTN 4X4 STRL (SOFTGOODS) ×3 IMPLANT
PADDING CAST COTTON 4X4 STRL (SOFTGOODS) ×9
PLATE LCP 3.5 1/3 TUB 8HX93 (Plate) ×2 IMPLANT
SCREW CANC FT 4.0X20 (Screw) ×2 IMPLANT
SCREW CANC FT ST SFS 4X14 (Screw) ×4 IMPLANT
SCREW CANN L THRD/30 4.0 (Screw) ×2 IMPLANT
SCREW CANN L THRD/40 4.0 (Screw) ×2 IMPLANT
SCREW CORTEX 3.5 12MM (Screw) ×2 IMPLANT
SCREW CORTEX 3.5 14MM (Screw) ×4 IMPLANT
SCREW CORTEX 3.5 18MM (Screw) ×2 IMPLANT
SCREW CORTEX 3.5 22MM (Screw) ×2 IMPLANT
SCREW LOCK CORT ST 3.5X12 (Screw) IMPLANT
SCREW LOCK CORT ST 3.5X14 (Screw) IMPLANT
SCREW LOCK CORT ST 3.5X18 (Screw) IMPLANT
SCREW LOCK CORT ST 3.5X22 (Screw) IMPLANT
SPLINT CAST 1 STEP 4X30 (MISCELLANEOUS) ×6 IMPLANT
SPONGE LAP 18X18 5 PK (GAUZE/BANDAGES/DRESSINGS) ×3 IMPLANT
STAPLER SKIN PROX 35W (STAPLE) ×3 IMPLANT
STOCKINETTE STRL 6IN 960660 (GAUZE/BANDAGES/DRESSINGS) ×3 IMPLANT
STRIP CLOSURE SKIN 1/2X4 (GAUZE/BANDAGES/DRESSINGS) ×4 IMPLANT
SUT VIC AB 2-0 SH 27 (SUTURE) ×6
SUT VIC AB 2-0 SH 27XBRD (SUTURE) ×2 IMPLANT
SYR 30ML LL (SYRINGE) ×3 IMPLANT
TAPE MICROPORE 2IN (TAPE) ×3 IMPLANT

## 2015-09-18 NOTE — Anesthesia Procedure Notes (Signed)
Procedure Name: LMA Insertion Date/Time: 09/18/2015 1:19 PM Performed by: Johnna Acosta Pre-anesthesia Checklist: Patient identified, Emergency Drugs available, Suction available, Patient being monitored and Timeout performed Patient Re-evaluated:Patient Re-evaluated prior to inductionOxygen Delivery Method: Circle system utilized Preoxygenation: Pre-oxygenation with 100% oxygen Intubation Type: IV induction Ventilation: Mask ventilation without difficulty LMA: LMA inserted LMA Size: 3.5 Tube type: Oral Number of attempts: 1 Placement Confirmation: positive ETCO2 and breath sounds checked- equal and bilateral Tube secured with: Tape Dental Injury: Teeth and Oropharynx as per pre-operative assessment

## 2015-09-18 NOTE — H&P (Signed)
PREOPERATIVE H&P  Chief Complaint: [id number] Displaced trimalleolar fracture of right lower leg, init  HPI: Cindy Hicks is a 55 y.o. female who presents for preoperative history and physical with a diagnosis of S82.851A Displaced trimalleolar fracture of right lower leg, init. Patient sustained an injury at home on 09/11/2015 when she fell about her back door. She is having significant right ankle pain. The x-rays of the fracture showed displacement of the fracture with subluxation of the talus. Based on her trimalleolar fracture with displacement I recommended open reduction internal fixation for her injury.  This is significantly impairing activities of daily living.  She has agreed with the plan for surgical management.   Past Medical History  Diagnosis Date  . Bronchitis    Past Surgical History  Procedure Laterality Date  . Ankle surgery Left 2000    fractured ankle, Dr. Jones Bales. [facility]  . Fracture surgery    . Cyst excision  2015    cervical, Dr. Georgianne Fick   Social History   Social History  . Marital Status: Single    Spouse Name: N/A  . Number of Children: N/A  . Years of Education: N/A   Social History Main Topics  . Smoking status: Current Some Day Smoker -- 0.25 packs/day    Types: Cigarettes  . Smokeless tobacco: Never Used  . Alcohol Use: Yes     Comment: occ  . Drug Use: No  . Sexual Activity: Yes   Other Topics Concern  . None   Social History Narrative   History reviewed. No pertinent family history. Allergies  Allergen Reactions  . Oxycodone Itching    "severe"   Prior to Admission medications   Medication Sig Start Date End Date Taking? Authorizing Provider  diphenhydrAMINE (BENADRYL) 25 mg capsule Take 25 mg by mouth every 6 (six) hours as needed for itching.   Yes Historical Provider, MD  traMADol (ULTRAM) 50 MG tablet Take 50 mg by mouth every 6 (six) hours as needed.   Yes Historical Provider, MD     Positive ROS: All other systems  have been reviewed and were otherwise negative with the exception of those mentioned in the HPI and as above.  Physical Exam: General: Alert, no acute distress Cardiovascular: Regular rate and rhythm, no murmurs rubs or gallops.  No pedal edema Respiratory: Clear to auscultation bilaterally, no wheezes rales or rhonchi. No cyanosis, no use of accessory musculature GI: No organomegaly, abdomen is soft and non-tender nondistended with positive bowel sounds. Skin: Skin intact, no lesions within the operative field. Neurologic: Sensation intact distally Psychiatric: Patient is competent for consent with normal mood and affect Lymphatic: No cervical lymphadenopathy  MUSCULOSKELETAL: Right lower extremity: Patient's right leg is in a cast. I had examined her ankle and my office which showed intact skin but global swelling of the right ankle. Patient had palpable pedal pulses at that time. Today she can flex and extend her toes and has intact sensation in all toes of the distal end of her cast.  Assessment: S82.851A Displaced trimalleolar fracture of right lower leg, init  Plan: Plan for Procedure(s): OPEN REDUCTION INTERNAL FIXATION (ORIF) ANKLE FRACTURE  I discussed today in the preoperative area with the patient and her husband the details of the operation as well as the postoperative course. We also discussed the risks and benefits of surgery. The risks include but are not limited to infection, bleeding requiring blood transfusion, nerve or blood vessel injury, joint stiffness or loss of  motion, persistent pain, weakness or instability, malunion, nonunion and hardware failure and the need for further surgery. Medical risks include but are not limited to DVT and pulmonary embolism, myocardial infarction, stroke, pneumonia, respiratory failure and death. Patient understood these risks and wished to proceed.   Thornton Park, MD   09/18/2015 1:02 PM

## 2015-09-18 NOTE — Transfer of Care (Signed)
Immediate Anesthesia Transfer of Care Note  Patient: Cindy Hicks  Procedure(s) Performed: Procedure(s): OPEN REDUCTION INTERNAL FIXATION (ORIF) ANKLE FRACTURE (Right)  Patient Location: PACU  Anesthesia Type:General  Level of Consciousness: sedated  Airway & Oxygen Therapy: Patient Spontanous Breathing and Patient connected to face mask oxygen  Post-op Assessment: Report given to RN and Post -op Vital signs reviewed and stable  Post vital signs: Reviewed and stable  Last Vitals:  Filed Vitals:   09/18/15 1212  BP: 133/86  Pulse: 86  Temp: 37.1 C  Resp: 20    Last Pain:  Filed Vitals:   09/18/15 1215  PainSc: 5       Patients Stated Pain Goal: 2 ([id number])  Complications: No apparent anesthesia complications

## 2015-09-18 NOTE — Anesthesia Postprocedure Evaluation (Signed)
Anesthesia Post Note  Patient: Cindy Hicks  Procedure(s) Performed: Procedure(s) (LRB): OPEN REDUCTION INTERNAL FIXATION (ORIF) ANKLE FRACTURE (Right)  Patient location during evaluation: PACU Anesthesia Type: General Level of consciousness: awake and alert Pain management: pain level controlled Vital Signs Assessment: post-procedure vital signs reviewed and stable Respiratory status: spontaneous breathing, nonlabored ventilation, respiratory function stable and patient connected to nasal cannula oxygen Cardiovascular status: blood pressure returned to baseline and stable Postop Assessment: no signs of nausea or vomiting Anesthetic complications: no    Last Vitals:  Filed Vitals:   09/18/15 1843 09/18/15 1940  BP: 128/70 139/99  Pulse: 83 81  Temp: 36.4 C 37.1 C  Resp: 18 19    Last Pain:  Filed Vitals:   09/18/15 1940  PainSc: [place]

## 2015-09-18 NOTE — Op Note (Signed)
09/18/2015  4:04 PM  PATIENT:  Cindy Hicks    PRE-OPERATIVE DIAGNOSIS:  S82.851A Displaced trimalleolar fracture of right lower leg, init  POST-OPERATIVE DIAGNOSIS:  Same  PROCEDURE:  OPEN REDUCTION INTERNAL FIXATION (ORIF) ANKLE FRACTURE  SURGEON:  Thornton Park, MD  ANESTHESIA:   General  PREOPERATIVE INDICATIONS:  Cindy Hicks is a  55 y.o. female with a diagnosis of S82.851A Displaced trimalleolar fracture of right lower leg, init who failed conservative measures and elected for surgical management.    I discussed the risks and benefits of surgery. The risks include but are not limited to infection, bleeding requiring blood transfusion, nerve or blood vessel injury, joint stiffness or loss of motion, persistent pain, weakness or instability, malunion, nonunion and hardware failure and the need for further surgery. Medical risks include but are not limited to DVT and pulmonary embolism, myocardial infarction, stroke, pneumonia, respiratory failure and death. Patient understood these risks and wished to proceed.   OPERATIVE IMPLANTS: Synthes 8 hole one third tubular plate and 2 x 4.0 mm cannulated screws  OPERATIVE FINDINGS: Trimalleolar ankle fracture with displaced lateral and medial malleolar fractures. Posterior malleolar fragment reduced without requiring fixation  OPERATIVE PROCEDURE:   Patient was met in the preoperative area. The right leg was signed my initials and the word yes according the hospital's correct site of surgery protocol. She was brought to the operating room where her cast was removed and she underwent general anesthesia. The patient was placed supine on the operative table. A bump was placed under the right hip. A tourniquet was applied to the right thigh.  The lower extremity was prepped and draped in a sterile fashion. A timeout was performed to verify the patient's name, date of birth, medical record number, correct site of surgery and correct  procedure to be performed. It was also used to verify the patient received antibiotics, and that all appropriate instruments, implants and radiographic studies were available in the room. Once all in attendance were in agreement, the case began.  The right lower extremity was exsanguinated with an Esmarch. The tourniquet was inflated to 275 mmHg. This was applied for a total of 99 minutes. A lateral incision was made over the fibula. The subcutaneous tissues were dissected with the Metzenbaum scissor and pickup. Care was taken to avoid injury to the superficial peroneal nerve. The lateral malleolus fracture was identified and irrigated and fracture hematoma was removed. Soft tissue was removed from the fracture site using a periosteal elevator. A fracture reduction clamp was then used to reduce the fracture to an anatomic position.     The lateral malleolus was then drilled in an AP direction, perpendicular to the fracture site to allow for placement of the lag screw.   A single lag screw, 22 mm in length, was advanced across the fracture site by hand. This compressed the fracture.   A 8 hole, 1/3 tubular plate was then contoured and placed along the lateral fibula. Bicortical screws were placed proximal to the fracture and fully threaded cancellus screws were placed distal the fracture. The fracture reduction and hardware placement were confirmed on AP and lateral imaging.  Once the lateral malleolus was plated, the attention was turned to the medial ankle. A small vertical incision was made over the tip of the medial malleolus.  Soft tissue was dissected with some with the Metzenbaum scissor and pickup. The fracture of the medial malleolus was identified. This was reduced with a dental pick. 2 threaded K  wires for the 4.0 cannulated screws were then advanced through the tip of the medial malleolus across the fracture site and into the distal tibia. The position of the K wires was evaluated on AP and lateral  FluoroScan images. The length of the wires were measured with a depth gauge and were determined to be 40 and  30 mm in length. The wires were then overdrilled with a cannulated drill for the 4.0 cannulated screws. The two long threaded 4.0 cannulated screws were then advanced into position by hand, compressing the medial malleolus fracture.    The posterior malleolus was then examined under fluoroscopy. It was felt to be less than 20% of the articular surface and was in a near anatomic position. The decision was made made not to place an AP screw given its stability and small size.  A stress test of the right ankle was then performed under fluoroscopy.  This test did not reveal any syndesmotic injury or opening of the medial clear space.  The medial and lateral incisions were then copiously irrigated. The subcutaneous tissue was closed with 2-0 Vicryl and the skin approximated staples. A dry sterile dressing was applied along with an AO splint. The patient's ankle was positioned in neutral. The patient was then awoken from anesthesia, transferred to hospital bed and brought to the PACU in stable condition. I was scrubbed and present the entire case and all sharp and instrument counts were correct at conclusion the case. I spoke to the patient's family in the post-op consultation room to let them know the case was performed without complication and the patient was stable in recovery room.    Timoteo Gaul, MD

## 2015-09-18 NOTE — Anesthesia Preprocedure Evaluation (Signed)
Anesthesia Evaluation  Patient identified by MRN, date of birth, ID band Patient awake    Reviewed: Allergy & Precautions, H&P , NPO status , Patient's Chart, lab work & pertinent test results, reviewed documented beta blocker date and time   History of Anesthesia Complications Negative for: history of anesthetic complications  Airway Mallampati: III  TM Distance: >3 FB Neck ROM: full    Dental no notable dental hx. (+) Missing, Loose, Poor Dentition   Pulmonary neg shortness of breath, neg sleep apnea, neg COPD, neg recent URI, Current Smoker,    Pulmonary exam normal breath sounds clear to auscultation       Cardiovascular Exercise Tolerance: Good negative cardio ROS Normal cardiovascular exam Rhythm:regular Rate:Normal     Neuro/Psych negative neurological ROS  negative psych ROS   GI/Hepatic negative GI ROS, Neg liver ROS,   Endo/Other  negative endocrine ROS  Renal/GU negative Renal ROS  negative genitourinary   Musculoskeletal   Abdominal   Peds  Hematology negative hematology ROS (+)   Anesthesia Other Findings Past Medical History:   Bronchitis                                                   Reproductive/Obstetrics negative OB ROS                             Anesthesia Physical Anesthesia Plan  ASA: II  Anesthesia Plan: General   Post-op Pain Management:    Induction:   Airway Management Planned:   Additional Equipment:   Intra-op Plan:   Post-operative Plan:   Informed Consent: I have reviewed the patients History and Physical, chart, labs and discussed the procedure including the risks, benefits and alternatives for the proposed anesthesia with the patient or authorized representative who has indicated his/her understanding and acceptance.   Dental Advisory Given  Plan Discussed with: Anesthesiologist, CRNA and Surgeon  Anesthesia Plan Comments:          Anesthesia Quick Evaluation

## 2015-09-19 DIAGNOSIS — S82852A Displaced trimalleolar fracture of left lower leg, initial encounter for closed fracture: Secondary | ICD-10-CM | POA: Diagnosis not present

## 2015-09-19 LAB — CBC
HEMATOCRIT: 39 % (ref 35.0–47.0)
HEMOGLOBIN: 12.9 g/dL (ref 12.0–16.0)
MCH: 28.6 pg (ref 26.0–34.0)
MCHC: 33.1 g/dL (ref 32.0–36.0)
MCV: 86.3 fL (ref 80.0–100.0)
Platelets: 287 10*3/uL (ref 150–440)
RBC: 4.52 MIL/uL (ref 3.80–5.20)
RDW: 14.2 % (ref 11.5–14.5)
WBC: 11.2 10*3/uL — ABNORMAL HIGH (ref 3.6–11.0)

## 2015-09-19 LAB — BASIC METABOLIC PANEL
ANION GAP: 8 (ref 5–15)
BUN: 17 mg/dL (ref 6–20)
CHLORIDE: 104 mmol/L (ref 101–111)
CO2: 25 mmol/L (ref 22–32)
Calcium: 9 mg/dL (ref 8.9–10.3)
Creatinine, Ser: 1.05 mg/dL — ABNORMAL HIGH (ref 0.44–1.00)
GFR calc non Af Amer: 60 mL/min — ABNORMAL LOW (ref >60)
Glucose, Bld: 173 mg/dL — ABNORMAL HIGH (ref 65–99)
POTASSIUM: 4.9 mmol/L (ref 3.5–5.1)
SODIUM: 137 mmol/L (ref 135–145)

## 2015-09-19 MED ORDER — HYDROCODONE-ACETAMINOPHEN 5-325 MG PO TABS: 1.0000 | ORAL_TABLET | ORAL | Status: DC | PRN

## 2015-09-19 MED ORDER — ASPIRIN EC 325 MG PO TBEC: 325.0000 mg | DELAYED_RELEASE_TABLET | Freq: Two times a day (BID) | ORAL | Status: DC

## 2015-09-19 NOTE — Discharge Instructions (Signed)
Given the patient's clinical improvement today she she'll be discharged home. She was instructed to continue strict elevation of the right lower extremity. She is also instructed to remain nonweightbearing on the right lower extremity for 6-8 weeks postop. She will use a walker for assistance with ambulation. Patient will take enteric-coated aspirin 325 mg by mouth twice a day for DVT prophylaxis. She'll follow-up in my office in approximately 10 days for wound check, staple removal and short leg cast application. Patient and her husband understood and agreed with this plan.

## 2015-09-19 NOTE — Progress Notes (Signed)
Physical Therapy Treatment Patient Details Name: Cindy Hicks MRN: [id number] DOB: Jun 28, 1960 Today's Date: 09/19/2015    History of Present Illness Cindy Hicks is a 55yo female who sustained an ankle injury at home on 6/7 resultant in a trimalleolar fracture on the RLE.  Pt. underwent an ORIF on 6/14..     PT Comments    Pt tolerating treatment session well, motivated and able to complete entire PT sesssion as planned. Pt continues to make progress toward goals as evidenced by performance of all HEP items with good understanding, demonstration of WB status without VC, and ability to tolerate stairs training. Pt's greatest limitation continues to be pain in RLE which continues to limit ability to perform all tasks without RLE elevated at baseline function.    Follow Up Recommendations  Home health PT     Equipment Recommendations  3in1 (PT);Standard walker    Recommendations for Other Services       Precautions / Restrictions Precautions Precautions: Fall Restrictions RLE Weight Bearing: Non weight bearing    Mobility  Bed Mobility Overal bed mobility: Independent                Transfers Overall transfer level: Needs assistance Equipment used: Rolling walker (2 wheeled) Transfers: Sit to/from Stand Sit to Stand: Supervision         General transfer comment: verbal cues for safety and technique with RW.  Ambulation/Gait                 Stairs Stairs: Yes Stairs assistance: Min guard Stair Management: No rails;Backwards;With walker Number of Stairs: 7 General stair comments: educated with husband at side to assist.  Wheelchair Mobility    Modified Rankin (Stroke Patients Only)       Balance Overall balance assessment: Modified Independent;No apparent balance deficits (not formally assessed)                                  Cognition Arousal/Alertness: Awake/alert Behavior During Therapy: WFL for tasks  assessed/performed Overall Cognitive Status: Within Functional Limits for tasks assessed                      Exercises Other Exercises Other Exercises: 1x5 RLE hip flexion seated (reviewed for HEP) Other Exercises: 1x5 RLE LAQ seated (reviewed for HEP) Other Exercises: 1x5 chair pushups (reviewed for HEP) Other Exercises: 1x5 contrlateral hip/shoulder flexion for core stability seated (reviewed for HEP)    General Comments        Pertinent Vitals/Pain Pain Assessment: 0-10 Pain Score: 5  Pain Location: RLE  Pain Descriptors / Indicators: Aching;Pins and needles Pain Intervention(s): Limited activity within patient's tolerance;Monitored during session;Premedicated before session;Repositioned    Home Living                      Prior Function            PT Goals (current goals can now be found in the care plan section) Acute Rehab PT Goals Patient Stated Goal: return to home and improve safety with mobility.  PT Goal Formulation: With patient Time For Goal Achievement: 10/03/15 Potential to Achieve Goals: Good Progress towards PT goals: Progressing toward goals    Frequency  BID    PT Plan Current plan remains appropriate    Co-evaluation             End of Session Equipment  Utilized During Treatment: Gait belt Activity Tolerance: Patient tolerated treatment well;Patient limited by fatigue Patient left: with call bell/phone within reach;in bed;with family/visitor present     Time: [id number] PT Time Calculation (min) (ACUTE ONLY): 21 min  Charges:  $Therapeutic Activity: 8-22 mins                    G Codes:      3:32 PM, Oct 04, 2015 Etta Grandchild, PT, DPT PRN Physical Therapist - [place] License # [id number] [phone] (mobile)

## 2015-09-19 NOTE — Care Management (Signed)
DME requested from [facility] prior to patient discharge to home today. Patient will follow up with Dr. Mack Guise on [place]. No home health ordered. I have paged Dr. Mack Guise for DME Rx. No further RNCM needs.

## 2015-09-19 NOTE — Discharge Summary (Signed)
Physician Discharge Summary  Patient ID: Cindy Hicks MRN: [id number] DOB/AGE: January 26, 1961 55 y.o.  Admit date: 09/18/2015 Discharge date: 09/19/2015  Admission Diagnoses:  S82.851A Displaced trimalleolar fracture of right lower leg   Discharge Diagnoses:  S82.851A Displaced trimalleolar fracture of right lower leg Active Problems: Right timalleolar fracture of ankle, closed   Past Medical History  Diagnosis Date  . Bronchitis     Surgeries: Procedure(s): OPEN REDUCTION INTERNAL FIXATION (ORIF) ANKLE FRACTURE on 09/18/2015   Consultants (if any):    Discharged Condition: Improved  Hospital Course: Cindy Hicks is an 55 y.o. female who was admitted 09/18/2015 with a diagnosis of  S82.851A Displaced trimalleolar fracture of right lower leg, init  and went to the operating room on 09/18/2015 and underwent ORIF of right ankle fracture  She was given perioperative antibiotics:  Anti-infectives    Start     Dose/Rate Route Frequency Ordered Stop   09/18/15 1900  ceFAZolin (ANCEF) IVPB 2g/100 mL premix     2 g 200 mL/hr over 30 Minutes Intravenous Every 6 hours 09/18/15 1713 09/19/15 0250   09/18/15 0913  ceFAZolin (ANCEF) 2-4 GM/100ML-% IVPB    Comments:  register, karen: cabinet override      09/18/15 0913 09/18/15 2114   09/18/15 0349  ceFAZolin (ANCEF) IVPB 2g/100 mL premix     2 g 200 mL/hr over 30 Minutes Intravenous On call to O.R. 09/18/15 0349 09/18/15 1312    .  She was given sequential compression devices, early ambulation, and lovenox  for DVT prophylaxis.  She benefited maximally from the hospital stay and there were no complications.    Recent vital signs:  Filed Vitals:   09/19/15 0733 09/19/15 0922  BP: 154/97   Pulse: 88 89  Temp: 98.6 F (37 C)   Resp: 18     Recent laboratory studies:  Lab Results  Component Value Date   HGB 12.9 09/19/2015   HGB 13.6 09/18/2015   HGB 14.4 09/17/2015   Lab Results  Component Value Date   WBC 11.2*  09/19/2015   PLT 287 09/19/2015   Lab Results  Component Value Date   INR 1.03 09/17/2015   Lab Results  Component Value Date   NA 137 09/19/2015   K 4.9 09/19/2015   CL 104 09/19/2015   CO2 25 09/19/2015   BUN 17 09/19/2015   CREATININE 1.05* 09/19/2015   GLUCOSE 173* 09/19/2015    Discharge Medications:     Medication List    TAKE these medications        aspirin EC 325 MG tablet  Take 1 tablet (325 mg total) by mouth 2 (two) times daily.     HYDROcodone-acetaminophen 5-325 MG tablet  Commonly known as:  NORCO  Take 1-2 tablets by mouth every 4 (four) hours as needed for moderate pain. MAXIMUM TOTAL ACETAMINOPHEN DOSE IS 4000 MG PER DAY      ASK your doctor about these medications        diphenhydrAMINE 25 mg capsule  Commonly known as:  BENADRYL  Take 25 mg by mouth every 6 (six) hours as needed for itching.     traMADol 50 MG tablet  Commonly known as:  ULTRAM  Take 50 mg by mouth every 6 (six) hours as needed.        Diagnostic Studies: Dg Ankle Complete Right  09/11/2015  CLINICAL DATA:  55 year old female with fall and right ankle pain. EXAM: RIGHT ANKLE - COMPLETE 3+ VIEW COMPARISON:  None. FINDINGS: There is a mildly displaced oblique fracture of the distal fibula with extension of the fracture into the distal tibiofibular syndesmosis. There is fracture of the medial malleolus with approximately no 4 mm distraction. There is a small fracture fragment along the posterior tibial plafond likely fracture of the posterior malleolus. There is minimal bleeding of the medial talotibial articular space. There is diffuse soft swelling of the ankle. IMPRESSION: Fractures of the distal fibula and medial malleolus. A fracture fragment along the posterior aspect of distal tibia on the lateral projection likely represents a fracture of the posterior malleolus. CT may provide better evaluation of the ankle. Electronically Signed   By: Anner Crete M.D.   On: 09/11/2015 00:09    Dg Ankle Right Port  09/18/2015  CLINICAL DATA:  Postop trimalleolar right ankle fracture repair EXAM: PORTABLE RIGHT ANKLE - 2 VIEW COMPARISON:  09/10/2015 FINDINGS: The patient is status post intraoperative repair of right ankle fracture. In metallic fixation plate and metallic fixation screws are noted in distal right fibula. Two oblique fixation screws are noted in distal tibia medial malleolus. Medial and lateral skin staples are noted. Casting material artifacts are noted. There is anatomic alignment. IMPRESSION: Status post intraoperative repair of right ankle fracture. Metallic fixation plate and screws are noted in distal right fibula. 2 metallic fixation screws are noted in distal tibia medial malleolus. There is anatomic alignment. Casting material artifacts are noted. Electronically Signed   By: Lahoma Crocker M.D.   On: 09/18/2015 17:00    Disposition: 01-Home or Self Care      Discharge Instructions    Call MD / Call 911    Complete by:  As directed   If you experience chest pain or shortness of breath, CALL 911 and be transported to the hospital emergency room.  If you develope a fever above 101 F, pus (white drainage) or increased drainage or redness at the wound, or calf pain, call your surgeon's office.     Constipation Prevention    Complete by:  As directed   Drink plenty of fluids.  Prune juice may be helpful.  You may use a stool softener, such as Colace (over the counter) 100 mg twice a day.  Use MiraLax (over the counter) for constipation as needed.     Diet general    Complete by:  As directed      Discharge instructions    Complete by:  As directed   Given the patient's clinical improvement today she she'll be discharged home. She was instructed to continue strict elevation of the right lower extremity. She is also instructed to remain nonweightbearing on the right lower extremity for 6-8 weeks postop. She will use a walker for assistance with ambulation. Patient will take  enteric-coated aspirin 325 mg by mouth twice a day for DVT prophylaxis. She'll follow-up in my office in approximately 10 days for wound check, staple removal and short leg cast application. Patient and her husband understood and agreed with this plan.     Driving restrictions    Complete by:  As directed   No driving for 6-8 weeks     Increase activity slowly as tolerated    Complete by:  As directed      Lifting restrictions    Complete by:  As directed   No lifting for 12-16 weeks              Signed: Thornton Park ,MD 09/19/2015, 12:32 PM

## 2015-09-19 NOTE — Progress Notes (Signed)
Subjective:  POD #1 s/p right trimalleolar ankle fracture ORIF. Patient reports pain as mild.  Patient has no other complaints. Her husband is at the bedside.  Objective:   VITALS:   Filed Vitals:   09/18/15 2306 09/19/15 0358 09/19/15 0733 09/19/15 0922  BP: 161/87 158/81 154/97   Pulse: 66 92 88 89  Temp: 98.5 F (36.9 C) 97.6 F (36.4 C) 98.6 F (37 C)   TempSrc:  Oral Oral   Resp: 18 18 18    Height:      Weight:      SpO2: 99% 99% 99% 98%    PHYSICAL EXAM:  Right lower extremity: Patient's splint and dressing are clean dry and intact. She can flex and extend her toes. She has intact sensation light touch over her toes.  Patient's toes are well perfused and are warm to touch.   LABS  Results for orders placed or performed during the hospital encounter of 09/18/15 (from the past 24 hour(s))  CBC     Status: Abnormal   Collection Time: 09/18/15  7:06 PM  Result Value Ref Range   WBC 11.6 (H) 3.6 - 11.0 K/uL   RBC 4.78 3.80 - 5.20 MIL/uL   Hemoglobin 13.6 12.0 - 16.0 g/dL   HCT 42.1 35.0 - 47.0 %   MCV 88.0 80.0 - 100.0 fL   MCH 28.3 26.0 - 34.0 pg   MCHC 32.2 32.0 - 36.0 g/dL   RDW 14.3 11.5 - 14.5 %   Platelets 288 150 - 440 K/uL  Creatinine, serum     Status: Abnormal   Collection Time: 09/18/15  7:06 PM  Result Value Ref Range   Creatinine, Ser 1.04 (H) 0.44 - 1.00 mg/dL   GFR calc non Af Amer >60 >60 mL/min   GFR calc Af Amer >60 >60 mL/min  CBC     Status: Abnormal   Collection Time: 09/19/15  3:49 AM  Result Value Ref Range   WBC 11.2 (H) 3.6 - 11.0 K/uL   RBC 4.52 3.80 - 5.20 MIL/uL   Hemoglobin 12.9 12.0 - 16.0 g/dL   HCT 39.0 35.0 - 47.0 %   MCV 86.3 80.0 - 100.0 fL   MCH 28.6 26.0 - 34.0 pg   MCHC 33.1 32.0 - 36.0 g/dL   RDW 14.2 11.5 - 14.5 %   Platelets 287 150 - 440 K/uL  Basic metabolic panel     Status: Abnormal   Collection Time: 09/19/15  3:49 AM  Result Value Ref Range   Sodium 137 135 - 145 mmol/L   Potassium 4.9 3.5 - 5.1 mmol/L    Chloride 104 101 - 111 mmol/L   CO2 25 22 - 32 mmol/L   Glucose, Bld 173 (H) 65 - 99 mg/dL   BUN 17 6 - 20 mg/dL   Creatinine, Ser 1.05 (H) 0.44 - 1.00 mg/dL   Calcium 9.0 8.9 - 10.3 mg/dL   GFR calc non Af Amer 60 (L) >60 mL/min   GFR calc Af Amer >60 >60 mL/min   Anion gap 8 5 - 15    Dg Ankle Right Port  09/18/2015  CLINICAL DATA:  Postop trimalleolar right ankle fracture repair EXAM: PORTABLE RIGHT ANKLE - 2 VIEW COMPARISON:  09/10/2015 FINDINGS: The patient is status post intraoperative repair of right ankle fracture. In metallic fixation plate and metallic fixation screws are noted in distal right fibula. Two oblique fixation screws are noted in distal tibia medial malleolus. Medial and lateral skin staples are  noted. Casting material artifacts are noted. There is anatomic alignment. IMPRESSION: Status post intraoperative repair of right ankle fracture. Metallic fixation plate and screws are noted in distal right fibula. 2 metallic fixation screws are noted in distal tibia medial malleolus. There is anatomic alignment. Casting material artifacts are noted. Electronically Signed   By: Lahoma Crocker M.D.   On: 09/18/2015 17:00    Assessment/Plan: 1 Day Post-Op   Active Problems:   Trimalleolar fracture of ankle, closed  Patient doing well postop. Her pain is controlled. She'll be discharged home today. She was instructed to continue strict elevation of the right lower extremity at home. She is to remain nonweightbearing on the right lower extremity for a minimum of 6 weeks postop. She will take enteric-coated aspirin 325 mg by mouth twice a day for DVT prophylaxis. Patient will follow up my office in 10 days for wound check, staple removal and short leg cast application. Patient will use a walker for assistance with ambulation in the meantime.    Thornton Park , MD 09/19/2015, 12:18 PM

## 2015-09-19 NOTE — Evaluation (Signed)
Physical Therapy Evaluation Patient Details Name: Cindy Hicks MRN: [id number] DOB: 1960-08-15 Today's Date: 09/19/2015   History of Present Illness  Cindy Hicks is a 55yo black female who sustained an ankle injury at home on 6/7 resultant in a trimalleolar fracture on the RLE. Pt presents on 6/15, POD1 R ankle ORIF. Pt has been attempting mobility with AC at home prior to surgery with limited success and self-propsed poor fluency/stability.   Clinical Impression  Pt presenting s/p R ankle ORIF with deficits outlined in note below, limiting ability to perform ADL, IADL, and functional mobility at baseline level of independence and safety. Pt will benefit from skilled PT services to address the above and to improve safety and independence upon return to home.     Follow Up Recommendations Home health PT    Equipment Recommendations  3in1 (PT);Standard walker    Recommendations for Other Services       Precautions / Restrictions Precautions Precautions: Fall Restrictions Weight Bearing Restrictions: Yes RLE Weight Bearing: Non weight bearing      Mobility  Bed Mobility Overal bed mobility: Independent                Transfers Overall transfer level: Needs assistance Equipment used: Rolling walker (2 wheeled) Transfers: Sit to/from Stand Sit to Stand: Supervision         General transfer comment: verbal cues for safety and technique with RW.   Ambulation/Gait Ambulation/Gait assistance: Min guard Ambulation Distance (Feet): 36 Feet Assistive device: Rolling walker (2 wheeled)       General Gait Details: 2-point hop-to gait, NWB on RLE; alternates AMB and retro ambulation.   Stairs            Wheelchair Mobility    Modified Rankin (Stroke Patients Only)       Balance Overall balance assessment: Modified Independent                                           Pertinent Vitals/Pain Pain Assessment: 0-10 Pain Score: 4   Pain Location: R foot/ankle Pain Descriptors / Indicators: Aching;Tingling;Pins and needles Pain Intervention(s): Limited activity within patient's tolerance;Monitored during session;Repositioned    Home Living Family/patient expects to be discharged to:: Private residence Living Arrangements: Spouse/significant other (husband/sister) Available Help at Discharge: Family;Friend(s) Type of Home: House Home Access: Stairs to enter Entrance Stairs-Rails: Right Entrance Stairs-Number of Steps: 5 in back; 7 in front Home Layout: One level Home Equipment: Crutches      Prior Function Level of Independence: Independent               Hand Dominance        Extremity/Trunk Assessment   Upper Extremity Assessment: Overall WFL for tasks assessed           Lower Extremity Assessment: Overall WFL for tasks assessed         Communication   Communication: No difficulties  Cognition Arousal/Alertness: Awake/alert                          General Comments      Exercises General Exercises - Lower Extremity Long Arc Quad: AROM;Right;15 reps;Seated      Assessment/Plan    PT Assessment Patient needs continued PT services  PT Diagnosis Difficulty walking;Generalized weakness;Abnormality of gait   PT Problem List Decreased range of motion;Decreased  activity tolerance;Decreased mobility;Obesity;Decreased knowledge of precautions;Decreased safety awareness;Pain  PT Treatment Interventions Gait training;Stair training;DME instruction;Functional mobility training;Therapeutic activities;Therapeutic exercise;Balance training;Patient/family education   PT Goals (Current goals can be found in the Care Plan section) Acute Rehab PT Goals Patient Stated Goal: return to home and improve safety with mobility.  PT Goal Formulation: With patient Time For Goal Achievement: 10/03/15 Potential to Achieve Goals: Good    Frequency BID   Barriers to discharge   5 stairs to  enter    Co-evaluation               End of Session Equipment Utilized During Treatment: Gait belt Activity Tolerance: Patient tolerated treatment well;Patient limited by pain Patient left: in chair;with call bell/phone within reach;with nursing/sitter in room Nurse Communication: Mobility status    Functional Assessment Tool Used: Clinical Judgment  Functional Limitation: Mobility: Walking and moving around Mobility: Walking and Moving Around Current Status [id number]): At least 60 percent but less than 80 percent impaired, limited or restricted Mobility: Walking and Moving Around Goal Status [phone]): At least 60 percent but less than 80 percent impaired, limited or restricted    Time: 0817-0832 PT Time Calculation (min) (ACUTE ONLY): 15 min   Charges:   PT Evaluation $PT Eval Low Complexity: 1 Procedure PT Treatments $Therapeutic Activity: 8-22 mins   PT G Codes:   PT G-Codes **NOT FOR INPATIENT CLASS** Functional Assessment Tool Used: Clinical Judgment  Functional Limitation: Mobility: Walking and moving around Mobility: Walking and Moving Around Current Status [id number]): At least 60 percent but less than 80 percent impaired, limited or restricted Mobility: Walking and Moving Around Goal Status [phone]): At least 60 percent but less than 80 percent impaired, limited or restricted    9:31 AM, 09/19/2015 Etta Grandchild, PT, DPT PRN Physical Therapist - [place] License # [id number] [phone] (mobile)

## 2015-09-19 NOTE — Discharge Summary (Signed)
Patient is instructed to continue strict elevation of the right lower extremity. She must remain nonweightbearing on the right lower extremity for 6-8 weeks postop. She will use a walker for assistance with ambulation. Patient will take enteric-coated aspirin 325 mg by mouth twice a day for DVT prophylaxis. She'll follow-up in my office in approximately 10 days for wound check, staple removal and short leg cast application. Patient and her husband understood and agreed with this plan.

## 2015-09-19 NOTE — Progress Notes (Signed)
Pharmacy notified of tylenol iv not being on unit. Awaiting for 0600 tylenol to arrive.

## 2015-09-19 NOTE — Progress Notes (Signed)
Pt. Refused ice packs to ankle.

## 2015-09-19 NOTE — Care Management (Signed)
DME delivered to this room by [facility] with [facility]. Case closed.

## 2015-09-19 NOTE — Evaluation (Signed)
Occupational Therapy Evaluation Patient Details Name: Cindy Hicks MRN: [id number] DOB: 09/04/1960 Today's Date: 09/19/2015    History of Present Illness Cindy Hicks is a 55yo female who sustained an ankle injury at home on 6/7 resultant in a trimalleolar fracture on the RLE.  Pt. underwent an ORIF on 6/14..    Clinical Impression   Pt. Is a 55 y.o. Female who was admitted for an ORIF repair of a Trimalleolar Fracture on the Righ LE. Pt. Presents with NWB through the RLE, weakness, and impaired functional mobility during ADLs which hinder her ability to complete ADL and IADL tasks. Pt. Could benefit from followup OT skilled services for ADL and A/E retraining, functional mobility for ADLs, and home modification in order to improve ADL and IADL skills. Pt. Plans to return home with husband.    Follow Up Recommendations  Home health OT    Equipment Recommendations       Recommendations for Other Services PT consult     Precautions / Restrictions Precautions Precautions: Fall Restrictions Weight Bearing Restrictions: Yes RLE Weight Bearing: Non weight bearing      Mobility Bed Mobility Overal bed mobility: Independent                Transfers Overall transfer level: Needs assistance Equipment used: Rolling walker (2 wheeled) Transfers: Sit to/from Stand Sit to Stand: Supervision         General transfer comment: verbal cues for safety and technique with RW.     Balance Overall balance assessment: Modified Independent                                          ADL Overall ADL's : Needs assistance/impaired                                       General ADL Comments: Pt. requires [place] LE dressing skills. Pt. education was provided about proper A/E use for LE dressing.     Vision  Pt. reports a new onset of blurriness in her left eye since the fall.   Perception     Praxis      Pertinent Vitals/Pain Pain  Assessment: 0-10 Pain Score: 4  Pain Location: Right Ankle Pain Descriptors / Indicators: Tingling Pain Intervention(s): Limited activity within patient's tolerance;Monitored during session;Repositioned     Hand Dominance Right   Extremity/Trunk Assessment Upper Extremity Assessment Upper Extremity Assessment: Overall WFL for tasks assessed       Communication Communication Communication: No difficulties   Cognition Arousal/Alertness: Awake/alert   Overall Cognitive Status: Within Functional Limits for tasks assessed                     General Comments       Exercises       Shoulder Instructions      Home Living Family/patient expects to be discharged to:: Private residence Living Arrangements: Spouse/significant other Available Help at Discharge: Family;Friend(s) Type of Home: House Home Access: Stairs to enter [employer] of Steps: 5 in back; 7 in front Entrance Stairs-Rails: Right Home Layout: One level     Bathroom Shower/Tub: Tub/shower unit;Curtain Shower/tub characteristics: Architectural technologist: Standard     Home Equipment: Crutches          Prior Functioning/Environment Level of  Independence: Independent             OT Diagnosis: Generalized weakness   OT Problem List: Decreased strength;Pain;Decreased knowledge of use of DME or AE   OT Treatment/Interventions: Self-care/ADL training;Therapeutic activities;Therapeutic exercise;Cognitive remediation/compensation;DME and/or AE instruction;Patient/family education    OT Goals(Current goals can be found in the care plan section) Acute Rehab OT Goals Patient Stated Goal: return to home and improve safety with mobility.   OT Frequency: Min 1X/week   Barriers to D/C:            Co-evaluation              End of Session    Activity Tolerance: Patient tolerated treatment well Patient left: in chair;with call bell/phone within reach;with chair alarm set    Time: 1001-1024 OT Time Calculation (min): 23 min Charges:  OT General Charges $OT Visit: 1 Procedure OT Evaluation $OT Eval Low Complexity: 1 Procedure G-Codes:    Cindy Carina, MS, OTR/L Cindy Hicks 09/19/2015, 11:11 AM

## 2015-09-19 NOTE — Care Management Note (Signed)
Case Management Note  Patient Details  Name: Cindy Hicks MRN: [id number] Date of Birth: 09/10/1960  Subjective/Objective:                  Met with patient to discuss discharge planning. She states her husband and family will support her in the home. She wants a knee scooter and a bedside commode. She does not have a front-wheeled walker at home. She does not have a PCP and does not take any home meds. She uses [facility] when she needs meds. No OB/GYN either. She does not feel that she will need home health services.  Action/Plan: No PCP - no home health will be available unless ortho will cover for home health medical needs too. I have requested bedside commode and price check on knee scooter for this patient with [facility].   Expected Discharge Date:                  Expected Discharge Plan:     In-House Referral:     Discharge planning Services  CM Consult  Post Acute Care Choice:  Home Health, Durable Medical Equipment Choice offered to:  Patient  DME Arranged:    DME Agency:     HH Arranged:    HH Agency:     Status of Service:  In process, will continue to follow  Medicare Important Message Given:    Date Medicare IM Given:    Medicare IM give by:    Date Additional Medicare IM Given:    Additional Medicare Important Message give by:     If discussed at Long Length of Stay Meetings, dates discussed:    Additional Comments:  Angela Johnson, RN 09/19/2015, 9:24 AM  

## 2015-09-20 ENCOUNTER — Encounter: Payer: Self-pay | Admitting: Orthopedic Surgery

## 2015-09-20 NOTE — Addendum Note (Signed)
Addendum  created 09/20/15 1432 by Johnna Acosta, CRNA   Modules edited: Anesthesia Medication Administration

## 2015-09-23 NOTE — Progress Notes (Signed)
   09/19/15 1112  OT G-codes **NOT FOR INPATIENT CLASS**  Functional Assessment Tool Used Clinical Judgement based on pt. current functional status  Functional Limitation Self care  Self Care Current Status [id number]) CK  Self Care Goal Status [id number]) CI  Self Care Discharge Status [id number]) CK   Late Entry G codes entered by Harrel Carina, MS, OTR/L Assessment performed and documented by Harrel Carina, MS, OTR/L

## 2016-02-11 ENCOUNTER — Encounter: Payer: Self-pay | Admitting: *Deleted

## 2016-02-11 ENCOUNTER — Ambulatory Visit
Admission: EM | Admit: 2016-02-11 | Discharge: 2016-02-11 | Disposition: A | Discharge status: Home / Self Care | Attending: Family Medicine | Admitting: Family Medicine

## 2016-02-11 DIAGNOSIS — Z23 Encounter for immunization: Secondary | ICD-10-CM

## 2016-02-11 DIAGNOSIS — J40 Bronchitis, not specified as acute or chronic: Secondary | ICD-10-CM | POA: Diagnosis not present

## 2016-02-11 MED ORDER — AZITHROMYCIN 250 MG PO TABS: ORAL_TABLET | ORAL | 0 refills | Status: DC

## 2016-02-11 MED ORDER — BENZONATATE 100 MG PO CAPS: 100.0000 mg | ORAL_CAPSULE | Freq: Three times a day (TID) | ORAL | 0 refills | Status: DC | PRN

## 2016-02-11 MED ORDER — TETANUS-DIPHTH-ACELL PERTUSSIS 5-2.5-18.5 LF-MCG/0.5 IM SUSP
0.5000 mL | Freq: Once | INTRAMUSCULAR | Status: AC
  Administered 2016-02-11: 0.5 mL via INTRAMUSCULAR

## 2016-02-11 MED ORDER — PREDNISONE 10 MG PO TABS: ORAL_TABLET | ORAL | 0 refills | Status: DC

## 2016-02-11 MED ORDER — ALBUTEROL SULFATE HFA 108 (90 BASE) MCG/ACT IN AERS: 2.0000 | INHALATION_SPRAY | RESPIRATORY_TRACT | 0 refills | Status: DC | PRN

## 2016-02-11 MED ORDER — GUAIFENESIN-CODEINE 100-10 MG/5ML PO SOLN: 5.0000 mL | Freq: Every evening | ORAL | 0 refills | Status: DC | PRN

## 2016-02-11 NOTE — ED Triage Notes (Signed)
Productive cough- clear x3 weeks worse today. Past hx of bronchitis. States sore chest walls from coughing. Denies other symptoms.

## 2016-02-11 NOTE — ED Provider Notes (Signed)
[facility] ____________________________________________  Time seen: Approximately 4:47 PM  I have reviewed the triage vital signs and the nursing notes.   HISTORY  Chief Complaint Cough  HPI Cindy Hicks is a 55 y.o. female presents for complaint of 2-3 weeks of cough and congestion. Patient reports runny nose, occasional sore throat and cough. Patient reports now she is having some sinus congestion but with continued cough. States her biggest complaint is cough and reports intermittent wheezing. States cough is mostly a dry cough. Reports continues to eat and drink well. Denies fevers. Reports similar with history of bronchitis in the past. Patient reports some generalized soreness from coughing. Reports past smoker not current. Reports unresolved over-the-counter cough and congestion medications. States cough is worse at night.  Denies chest pain, shortness of breath, chest pain with deep breath, abdominal pain, dysuria, extremity pain or extremity swelling.. Reports has continued to remain active. Patient reports last sickness or immobilization was this past summer when she had right ankle surgery.  No LMP recorded. Patient is postmenopausal.   Past Medical History:  Diagnosis Date  . Bronchitis     Patient Active Problem List   Diagnosis Date Noted  . Trimalleolar fracture of ankle, closed 09/18/2015    Past Surgical History:  Procedure Laterality Date  . ANKLE SURGERY Left 2000   fractured ankle, Dr. Jones Bales. [facility]  . CYST EXCISION  2015   cervical, Dr. Georgianne Fick  . FRACTURE SURGERY    . ORIF ANKLE FRACTURE Right 09/18/2015   Procedure: OPEN REDUCTION INTERNAL FIXATION (ORIF) ANKLE FRACTURE;  Surgeon: Thornton Park, MD;  Location: [facility] ORS;  Service: Orthopedics;  Laterality: Right;    Current Outpatient Rx  . Order #: [id number] Class: Normal  . Order #: [id number] Class: Normal  . Order #: [id number] Class: Normal  . Order #: [id number] Class:  Normal  . Order #: [id number] Class: Print  . Order #: [id number] Class: Print  . Order #: [id number] Class: Print  . Order #: [id number] Class: Normal    No current facility-administered medications for this encounter.   Current Outpatient Prescriptions:  .  albuterol (PROVENTIL HFA;VENTOLIN HFA) 108 (90 Base) MCG/ACT inhaler, Inhale 2 puffs into the lungs every 4 (four) hours as needed., Disp: 1 Inhaler, Rfl: 0 .  aspirin EC 325 MG tablet, Take 1 tablet (325 mg total) by mouth 2 (two) times daily., Disp: 60 tablet, Rfl: 0 .  azithromycin (ZITHROMAX Z-PAK) 250 MG tablet, Take 2 tablets (500 mg) on  Day 1,  followed by 1 tablet (250 mg) once daily on Days 2 through 5., Disp: 6 each, Rfl: 0 .  benzonatate (TESSALON PERLES) 100 MG capsule, Take 1 capsule (100 mg total) by mouth 3 (three) times daily as needed., Disp: 15 capsule, Rfl: 0 .  guaiFENesin-codeine 100-10 MG/5ML syrup, Take 5 mLs by mouth at bedtime as needed for cough., Disp: 75 mL, Rfl: 0 .  HYDROcodone-acetaminophen (NORCO) 5-325 MG tablet, Take 1-2 tablets by mouth every 4 (four) hours as needed for moderate pain. MAXIMUM TOTAL ACETAMINOPHEN DOSE IS 4000 MG PER DAY, Disp: 60 tablet, Rfl: 0 .  HYDROcodone-acetaminophen (NORCO/VICODIN) 5-325 MG tablet, Take 1-2 tablets by mouth every 4 (four) hours as needed for moderate pain., Disp: 60 tablet, Rfl: 0 .  predniSONE (DELTASONE) 10 MG tablet, Start 60 mg po day one, then 50 mg po day two, taper by 10 mg daily until complete., Disp: 21 tablet, Rfl: 0  Allergies Hydrocodone and Oxycodone  History reviewed. No pertinent family history.  Social History Social History  Substance Use Topics  . Smoking status: Current Some Day Smoker    Packs/day: 0.25    Types: Cigarettes  . Smokeless tobacco: Never Used  . Alcohol use Yes     Comment: occ    Review of Systems Constitutional: No fever/chills Eyes: No visual changes. ENT: No sore throat. Cardiovascular: Denies chest  pain. Respiratory: Denies shortness of breath. As above. Gastrointestinal: No abdominal pain.  No nausea, no vomiting.  No diarrhea.  No constipation. Genitourinary: Negative for dysuria. Musculoskeletal: Negative for back pain. Skin: Negative for rash. Neurological: Negative for headaches, focal weakness or numbness.  10-point ROS otherwise negative.  ____________________________________________   PHYSICAL EXAM:  VITAL SIGNS: ED Triage Vitals  Enc Vitals Group     BP 02/11/16 1625 139/86     Pulse Rate 02/11/16 1625 78     Resp 02/11/16 1625 16     Temp 02/11/16 1625 98 F (36.7 C)     Temp Source 02/11/16 1625 Oral     SpO2 02/11/16 1625 98 %     Weight 02/11/16 1627 200 lb (90.7 kg)     Height 02/11/16 1627 5\' 3"  (1.6 m)     Head Circumference --      Peak Flow --      Pain Score --      Pain Loc --      Pain Edu? --      Excl. in [place]? --    Constitutional: Alert and oriented. Well appearing and in no acute distress. Eyes: Conjunctivae are normal. PERRL. EOMI. Head: Atraumatic. No sinus tenderness to palpation. No swelling. No erythema.  Ears: no erythema, normal TMs bilaterally.   Nose: Mild nasal congestion with clear rhinorrhea  Mouth/Throat: Mucous membranes are moist. No pharyngeal erythema. No tonsillar swelling or exudate.  Neck: No stridor.  No cervical spine tenderness to palpation. Hematological/Lymphatic/Immunilogical: No cervical lymphadenopathy. Cardiovascular: Normal rate, regular rhythm. Grossly normal heart sounds.  Good peripheral circulation. Respiratory: Normal respiratory effort.  No retractions. Mild scattered inspiratory wheezes. Mild scattered rhonchi, improved with cough. No focal area of consolidation auscultated. Dry intermittent cough noted in room. Speaks in complete sentences. Good air movement.  Gastrointestinal: Soft and nontender.  Musculoskeletal: No lower or upper extremity tenderness nor edema. No cervical, thoracic or lumbar  tenderness to palpation. No calf tenderness bilaterally. Bilateral pedal pulses equal and easily palpated. Neurologic:  Normal speech and language. No gross focal neurologic deficits are appreciated. No gait instability. Skin:  Skin is warm, dry and intact. No rash noted. Psychiatric: Mood and affect are normal. Speech and behavior are normal.  ___________________________________________   LABS (all labs ordered are listed, but only abnormal results are displayed)  Labs Reviewed - No data to display ____________________________________________  PROCEDURES Procedures    INITIAL IMPRESSION / ASSESSMENT AND PLAN / ED COURSE  Pertinent labs & imaging results that were available during my care of the patient were reviewed by me and considered in my medical decision making (see chart for details).  Well-appearing patient. No acute distress. Suspect bronchitis. Discussed evaluation and treatment options, patient denied chest x-ray. Will treat patient with oral azithromycin, prednisone taper, and when necessary Tessalon Perles, when necessary albuterol inhaler and when necessary guaifenesin with codeine. Patient reports tolerates codeine well. Encouraged rest, fluids and supportive care.  [place] controlled substance database reviewed with last controlled substance documented July 29 17 tramadol 60 tablets, 15 day supply from orthopedic.  Discussed follow up with  Primary care physician this week. Discussed follow up and return parameters including no resolution or any worsening concerns. Patient verbalized understanding and agreed to plan.   ____________________________________________   FINAL CLINICAL IMPRESSION(S) / ED DIAGNOSES  Final diagnoses:  Bronchitis     Discharge Medication List as of 02/11/2016  4:58 PM    START taking these medications   Details  albuterol (PROVENTIL HFA;VENTOLIN HFA) 108 (90 Base) MCG/ACT inhaler Inhale 2 puffs into the lungs every 4 (four) hours  as needed., Starting Tue 02/11/2016, Normal    azithromycin (ZITHROMAX Z-PAK) 250 MG tablet Take 2 tablets (500 mg) on  Day 1,  followed by 1 tablet (250 mg) once daily on Days 2 through 5., Normal    benzonatate (TESSALON PERLES) 100 MG capsule Take 1 capsule (100 mg total) by mouth 3 (three) times daily as needed., Starting Tue 02/11/2016, Normal    guaiFENesin-codeine 100-10 MG/5ML syrup Take 5 mLs by mouth at bedtime as needed for cough., Starting Tue 02/11/2016, Print    predniSONE (DELTASONE) 10 MG tablet Start 60 mg po day one, then 50 mg po day two, taper by 10 mg daily until complete., Normal        Note: This dictation was prepared with Dragon dictation along with smaller phrase technology. Any transcriptional errors that result from this process are unintentional.    Clinical Course       Marylene Land, NP 02/11/16 [place], NP 02/11/16 1858

## 2016-02-11 NOTE — Discharge Instructions (Signed)
Take medication as prescribed. Rest. Drink plenty of fluids.  ° °Follow up with your primary care physician this week as needed. Return to Urgent care for new or worsening concerns.  ° °

## 2016-02-11 NOTE — ED Triage Notes (Signed)
Pt requesting TDAP

## 2016-02-14 ENCOUNTER — Telehealth: Payer: Self-pay | Admitting: *Deleted

## 2016-02-14 NOTE — Telephone Encounter (Signed)
Courtesy call back, verified DOB, patient reported feeling better. Advised patient to follow up with PCP or [facility] if symptoms return. 

## 2017-05-11 ENCOUNTER — Emergency Department: Payer: 59

## 2017-05-11 ENCOUNTER — Other Ambulatory Visit: Payer: Self-pay

## 2017-05-11 ENCOUNTER — Emergency Department
Admission: EM | Admit: 2017-05-11 | Discharge: 2017-05-11 | Disposition: A | Discharge status: Home / Self Care | Payer: 59 | Attending: Emergency Medicine | Admitting: Emergency Medicine

## 2017-05-11 DIAGNOSIS — F17210 Nicotine dependence, cigarettes, uncomplicated: Secondary | ICD-10-CM | POA: Diagnosis not present

## 2017-05-11 DIAGNOSIS — W19XXXA Unspecified fall, initial encounter: Secondary | ICD-10-CM

## 2017-05-11 DIAGNOSIS — R42 Dizziness and giddiness: Secondary | ICD-10-CM | POA: Diagnosis present

## 2017-05-11 DIAGNOSIS — Z7982 Long term (current) use of aspirin: Secondary | ICD-10-CM | POA: Insufficient documentation

## 2017-05-11 LAB — BASIC METABOLIC PANEL
Anion gap: 10 (ref 5–15)
BUN: 10 mg/dL (ref 6–20)
CALCIUM: 9.1 mg/dL (ref 8.9–10.3)
CO2: 24 mmol/L (ref 22–32)
CREATININE: 0.73 mg/dL (ref 0.44–1.00)
Chloride: 104 mmol/L (ref 101–111)
GFR calc Af Amer: >60 mL/min (ref >60)
Glucose, Bld: 107 mg/dL — ABNORMAL HIGH (ref 65–99)
POTASSIUM: 3.9 mmol/L (ref 3.5–5.1)
SODIUM: 138 mmol/L (ref 135–145)

## 2017-05-11 LAB — CBC
HEMATOCRIT: 44.3 % (ref 35.0–47.0)
Hemoglobin: 14.9 g/dL (ref 12.0–16.0)
MCH: 30.4 pg (ref 26.0–34.0)
MCHC: 33.7 g/dL (ref 32.0–36.0)
MCV: 90.4 fL (ref 80.0–100.0)
PLATELETS: 232 10*3/uL (ref 150–440)
RBC: 4.9 MIL/uL (ref 3.80–5.20)
RDW: 13.6 % (ref 11.5–14.5)
WBC: 4.6 10*3/uL (ref 3.6–11.0)

## 2017-05-11 LAB — URINALYSIS, COMPLETE (UACMP) WITH MICROSCOPIC
BILIRUBIN URINE: NEGATIVE
Bacteria, UA: NONE SEEN
Glucose, UA: NEGATIVE mg/dL
Ketones, ur: NEGATIVE mg/dL
Leukocytes, UA: NEGATIVE
NITRITE: NEGATIVE
PH: 5 (ref 5.0–8.0)
Protein, ur: NEGATIVE mg/dL
SPECIFIC GRAVITY, URINE: 1.01 (ref 1.005–1.030)
Squamous Epithelial / LPF: NONE SEEN

## 2017-05-11 LAB — TROPONIN I: Troponin I: <0.03 ng/mL (ref <0.03)

## 2017-05-11 MED ORDER — TRAMADOL HCL 50 MG PO TABS
100.0000 mg | ORAL_TABLET | Freq: Once | ORAL | Status: AC
  Administered 2017-05-11: 100 mg via ORAL

## 2017-05-11 MED ORDER — TRAMADOL HCL 50 MG PO TABS: 50.0000 mg | ORAL_TABLET | Freq: Four times a day (QID) | ORAL | 0 refills | Status: AC | PRN

## 2017-05-11 MED ORDER — TRAMADOL HCL 50 MG PO TABS
ORAL_TABLET | ORAL | Status: AC
  Administered 2017-05-11: 100 mg via ORAL
  Filled 2017-05-11: qty 2

## 2017-05-11 MED ORDER — MECLIZINE HCL 25 MG PO TABS: 25.0000 mg | ORAL_TABLET | Freq: Three times a day (TID) | ORAL | 0 refills | Status: DC | PRN

## 2017-05-11 NOTE — ED Notes (Signed)
Pt ambulatory with slow steady gait to POV. VSS. NAD. Discharge instructions, RX and follow up reviewed. All questions answered.

## 2017-05-11 NOTE — ED Triage Notes (Signed)
Pt c/o having dizziness intermittent for the past 3 weeks, states today when she pick up her granddaughter she lost her balance and fell. Pt has noted abrasions to the face with c/o head and neck -pain and left hand pain.

## 2017-05-11 NOTE — ED Provider Notes (Signed)
[facility] Emergency Department Provider Note  Time seen: 8:42 PM  I have reviewed the triage vital signs and the nursing notes.   HISTORY  Chief Complaint Dizziness    HPI REVER PICHETTE is a 57 y.o. female with a past medical history of bronchitis, presents to the emergency department for dizziness and a fall.  According to the patient for the past 2-3 weeks she will intermittently get dizzy which she describes as a spinning sensation as if the room is spinning around her.  States initially it was causing her to feel nauseated as well but that has dissipated over the past week or so.  Patient states symptoms typically occur when she lies down or bends over.  States his symptoms are brief lasting only seconds or minutes.  Denies any history of vertigo in the past.  Denies any headache.  Patient states she went to pick up her goddaughter today became dizzy and ended up stumbling forward falling onto cement.  Patient hit her head does not believe she passed out but is not sure.  Denies vomiting.  Patient does have abrasions to her face right hand and left knee along with pain to the right hand and front teeth.   Past Medical History:  Diagnosis Date  . Bronchitis     Patient Active Problem List   Diagnosis Date Noted  . Trimalleolar fracture of ankle, closed 09/18/2015    Past Surgical History:  Procedure Laterality Date  . ANKLE SURGERY Left 2000   fractured ankle, Dr. Jones Bales. [facility]  . CYST EXCISION  2015   cervical, Dr. Georgianne Fick  . FRACTURE SURGERY    . ORIF ANKLE FRACTURE Right 09/18/2015   Procedure: OPEN REDUCTION INTERNAL FIXATION (ORIF) ANKLE FRACTURE;  Surgeon: Thornton Park, MD;  Location: [facility] ORS;  Service: Orthopedics;  Laterality: Right;    Prior to Admission medications   Medication Sig Start Date End Date Taking? Authorizing Provider  albuterol (PROVENTIL HFA;VENTOLIN HFA) 108 (90 Base) MCG/ACT inhaler Inhale 2 puffs into the  lungs every 4 (four) hours as needed. 02/11/16   Marylene Land, NP  aspirin EC 325 MG tablet Take 1 tablet (325 mg total) by mouth 2 (two) times daily. 09/19/15   Thornton Park, MD  azithromycin (ZITHROMAX Z-PAK) 250 MG tablet Take 2 tablets (500 mg) on  Day 1,  followed by 1 tablet (250 mg) once daily on Days 2 through 5. 02/11/16   Marylene Land, NP  benzonatate (TESSALON PERLES) 100 MG capsule Take 1 capsule (100 mg total) by mouth 3 (three) times daily as needed. 02/11/16   Marylene Land, NP  guaiFENesin-codeine 100-10 MG/5ML syrup Take 5 mLs by mouth at bedtime as needed for cough. 02/11/16   Marylene Land, NP  HYDROcodone-acetaminophen (NORCO) 5-325 MG tablet Take 1-2 tablets by mouth every 4 (four) hours as needed for moderate pain. MAXIMUM TOTAL ACETAMINOPHEN DOSE IS 4000 MG PER DAY 09/19/15   Thornton Park, MD  HYDROcodone-acetaminophen (NORCO/VICODIN) 5-325 MG tablet Take 1-2 tablets by mouth every 4 (four) hours as needed for moderate pain. 09/19/15   Thornton Park, MD  predniSONE (DELTASONE) 10 MG tablet Start 60 mg po day one, then 50 mg po day two, taper by 10 mg daily until complete. 02/11/16   Marylene Land, NP    Allergies  Allergen Reactions  . Hydrocodone Itching  . Oxycodone Itching    "severe"    No family history on file.  Social History Social History  Tobacco Use  . Smoking status: Current Some Day Smoker    Packs/day: 0.25    Types: Cigarettes  . Smokeless tobacco: Never Used  Substance Use Topics  . Alcohol use: Yes    Comment: occ  . Drug use: No    Review of Systems Constitutional: Does not believe she completely lost consciousness. Eyes: Negative for visual complaints ENT: Front teeth pain abrasion to nose and upper lip Cardiovascular: Negative for chest pain. Respiratory: Negative for shortness of breath. Gastrointestinal: Negative for abdominal pain, vomiting Genitourinary: Negative for urinary compaints Musculoskeletal: Left hand  pain abrasion the left knee and right hand Skin: Abrasions as described above Neurological: Negative for headache All other ROS negative  ____________________________________________   PHYSICAL EXAM:  VITAL SIGNS: ED Triage Vitals  Enc Vitals Group     BP 05/11/17 1859 (!) 185/96     Pulse Rate 05/11/17 1859 73     Resp 05/11/17 1859 18     Temp 05/11/17 1859 98.2 F (36.8 C)     Temp Source 05/11/17 1859 Oral     SpO2 05/11/17 1859 96 %     Weight 05/11/17 1900 192 lb (87.1 kg)     Height 05/11/17 1900 5\' 3"  (1.6 m)     Head Circumference --      Peak Flow --      Pain Score 05/11/17 1900 10     Pain Loc --      Pain Edu? --      Excl. in [place]? --     Constitutional: Alert and oriented. Well appearing and in no distress. Eyes: Normal exam ENT   Head: Abrasion to nose and upper lip.  Normal tympanic membranes.   Nose: No congestion/rhinnorhea.   Mouth/Throat: Mucous membranes are moist.  Moderate tenderness to palpation of the left upper central incisor, does not appear to be loose chipped or fractured.  No other dental injuries. Cardiovascular: Normal rate, regular rhythm. No murmurs, rubs, or gallops. Respiratory: Normal respiratory effort without tachypnea nor retractions. Breath sounds are clear  Gastrointestinal: Soft and nontender. No distention.   Musculoskeletal: Moderate tenderness to palpation of the left hand especially over the MCP joints of the third and fourth digits.  Small abrasions to dorsal aspect of right hand small abrasion to left knee.  Good range of motion all other extremities otherwise. Neurologic:  Normal speech and language. No gross focal neurologic deficits Skin:  Skin is warm.  Small abrasion to left knee and dorsal aspect of right hand abrasions to upper lip and nose. Psychiatric: Mood and affect are normal.   ____________________________________________    EKG  EKG reviewed and interpreted by myself shows normal sinus rhythm at  73 bpm with a narrow QRS, normal axis, normal intervals, no ST changes.  ____________________________________________    RADIOLOGY  X-ray negative for fracture CT head negative  ____________________________________________   INITIAL IMPRESSION / ASSESSMENT AND PLAN / ED COURSE  Pertinent labs & imaging results that were available during my care of the patient were reviewed by me and considered in my medical decision making (see chart for details).  Patient presents emergency department with dizziness and a fall.  Differential would include BPPV, less likely CVA, ICH or mass, vestibular abnormality ear infection.  Overall patient's labs are normal.  Troponin negative, urinalysis normal.  EKG is reassuring.  CT scan of the head is negative x-rays negative for fracture.  I discussed with the patient the possibility of BPPV, and a trial  of meclizine for symptom relief.  However as the patient's symptoms have been intermittent but ongoing for the past 2-3 weeks I will also refer to ENT for evaluation.  Patient agreeable to this plan of care.  We will discharge with a short course of tramadol for discomfort.  Patient states she is taking in the past without ill effects.  ____________________________________________   FINAL CLINICAL IMPRESSION(S) / ED DIAGNOSES  Fall Vertigo    Harvest Dark, MD 05/11/17 2047

## 2017-05-11 NOTE — Discharge Instructions (Signed)
Take your pain medication as needed, as written.  Please take your meclizine for any dizziness, as prescribed.  Please call the number to follow-up with ENT for further evaluation.  Please also follow-up with a dentist regarding your tooth pain.  Return to the emergency department for any personally concerning symptoms.

## 2017-07-07 NOTE — Discharge Instructions (Signed)

## 2017-07-11 ENCOUNTER — Encounter: Payer: Self-pay | Admitting: Anesthesiology

## 2017-07-12 ENCOUNTER — Encounter: Admission: RE | Payer: Self-pay | Source: Ambulatory Visit

## 2017-07-12 ENCOUNTER — Ambulatory Visit: Admission: RE | Admit: 2017-07-12 | Payer: 59 | Source: Ambulatory Visit | Admitting: Ophthalmology

## 2017-07-12 SURGERY — PHACOEMULSIFICATION, CATARACT, WITH IOL INSERTION
Anesthesia: Topical | Laterality: Left

## 2019-02-01 ENCOUNTER — Inpatient Hospital Stay (HOSPITAL_COMMUNITY)
Admission: EM | Admit: 2019-02-01 | Discharge: 2019-02-03 | DRG: 190 | Disposition: A | Discharge status: Home / Self Care | Payer: 59 | Attending: Internal Medicine | Admitting: Internal Medicine

## 2019-02-01 ENCOUNTER — Other Ambulatory Visit: Payer: Self-pay

## 2019-02-01 ENCOUNTER — Emergency Department (HOSPITAL_COMMUNITY): Payer: 59

## 2019-02-01 DIAGNOSIS — J441 Chronic obstructive pulmonary disease with (acute) exacerbation: Secondary | ICD-10-CM | POA: Diagnosis not present

## 2019-02-01 DIAGNOSIS — R0602 Shortness of breath: Secondary | ICD-10-CM | POA: Diagnosis not present

## 2019-02-01 DIAGNOSIS — F17210 Nicotine dependence, cigarettes, uncomplicated: Secondary | ICD-10-CM | POA: Diagnosis present

## 2019-02-01 DIAGNOSIS — J9601 Acute respiratory failure with hypoxia: Secondary | ICD-10-CM | POA: Diagnosis present

## 2019-02-01 DIAGNOSIS — Z20828 Contact with and (suspected) exposure to other viral communicable diseases: Secondary | ICD-10-CM | POA: Diagnosis present

## 2019-02-01 DIAGNOSIS — Z720 Tobacco use: Secondary | ICD-10-CM

## 2019-02-01 DIAGNOSIS — Z885 Allergy status to narcotic agent status: Secondary | ICD-10-CM

## 2019-02-01 DIAGNOSIS — T380X5A Adverse effect of glucocorticoids and synthetic analogues, initial encounter: Secondary | ICD-10-CM | POA: Diagnosis present

## 2019-02-01 DIAGNOSIS — R0902 Hypoxemia: Principal | ICD-10-CM

## 2019-02-01 DIAGNOSIS — R739 Hyperglycemia, unspecified: Secondary | ICD-10-CM | POA: Diagnosis present

## 2019-02-01 LAB — SARS CORONAVIRUS 2 BY RT PCR (HOSPITAL ORDER, PERFORMED IN ~~LOC~~ HOSPITAL LAB): SARS Coronavirus 2: NEGATIVE

## 2019-02-01 LAB — COMPREHENSIVE METABOLIC PANEL WITH GFR
ALT: 21 U/L (ref 0–44)
AST: 23 U/L (ref 15–41)
Albumin: 3.8 g/dL (ref 3.5–5.0)
Alkaline Phosphatase: 86 U/L (ref 38–126)
Anion gap: 8 (ref 5–15)
BUN: 11 mg/dL (ref 6–20)
CO2: 28 mmol/L (ref 22–32)
Calcium: 9 mg/dL (ref 8.9–10.3)
Chloride: 101 mmol/L (ref 98–111)
Creatinine, Ser: 0.92 mg/dL (ref 0.44–1.00)
GFR calc Af Amer: >60 mL/min
GFR calc non Af Amer: >60 mL/min
Glucose, Bld: 162 mg/dL — ABNORMAL HIGH (ref 70–99)
Potassium: 3.8 mmol/L (ref 3.5–5.1)
Sodium: 137 mmol/L (ref 135–145)
Total Bilirubin: 0.5 mg/dL (ref 0.3–1.2)
Total Protein: 7.6 g/dL (ref 6.5–8.1)

## 2019-02-01 LAB — CBC
HCT: 43.1 % (ref 36.0–46.0)
Hemoglobin: 13.6 g/dL (ref 12.0–15.0)
MCH: 29.1 pg (ref 26.0–34.0)
MCHC: 31.6 g/dL (ref 30.0–36.0)
MCV: 92.1 fL (ref 80.0–100.0)
Platelets: 237 10*3/uL (ref 150–400)
RBC: 4.68 MIL/uL (ref 3.87–5.11)
RDW: 13 % (ref 11.5–15.5)
WBC: 8.9 10*3/uL (ref 4.0–10.5)
nRBC: 0 % (ref 0.0–0.2)

## 2019-02-01 LAB — D-DIMER, QUANTITATIVE: D-Dimer, Quant: 0.38 ug{FEU}/mL (ref 0.00–0.50)

## 2019-02-01 MED ORDER — ALBUTEROL SULFATE (2.5 MG/3ML) 0.083% IN NEBU: 2.5000 mg | INHALATION_SOLUTION | RESPIRATORY_TRACT | Status: DC | PRN

## 2019-02-01 MED ORDER — ONDANSETRON HCL 4 MG PO TABS: 4.0000 mg | ORAL_TABLET | Freq: Four times a day (QID) | ORAL | Status: DC | PRN

## 2019-02-01 MED ORDER — IBUPROFEN 400 MG PO TABS
400.0000 mg | ORAL_TABLET | Freq: Four times a day (QID) | ORAL | Status: DC | PRN
  Administered 2019-02-02 (×3): 400 mg via ORAL
  Filled 2019-02-01 (×3): qty 1

## 2019-02-01 MED ORDER — ACETAMINOPHEN 325 MG PO TABS
650.0000 mg | ORAL_TABLET | Freq: Four times a day (QID) | ORAL | Status: DC | PRN
  Administered 2019-02-02 (×3): 650 mg via ORAL
  Filled 2019-02-01 (×3): qty 2

## 2019-02-01 MED ORDER — METHYLPREDNISOLONE SODIUM SUCC 40 MG IJ SOLR
40.0000 mg | Freq: Two times a day (BID) | INTRAMUSCULAR | Status: DC
  Administered 2019-02-02 – 2019-02-03 (×4): 40 mg via INTRAVENOUS
  Filled 2019-02-01 (×4): qty 1

## 2019-02-01 MED ORDER — ACETAMINOPHEN 650 MG RE SUPP: 650.0000 mg | Freq: Four times a day (QID) | RECTAL | Status: DC | PRN

## 2019-02-01 MED ORDER — TRAZODONE HCL 50 MG PO TABS
25.0000 mg | ORAL_TABLET | Freq: Every evening | ORAL | Status: DC | PRN
  Administered 2019-02-02 (×2): 25 mg via ORAL
  Filled 2019-02-01 (×2): qty 1

## 2019-02-01 MED ORDER — IPRATROPIUM-ALBUTEROL 0.5-2.5 (3) MG/3ML IN SOLN
3.0000 mL | Freq: Four times a day (QID) | RESPIRATORY_TRACT | Status: DC
  Administered 2019-02-02 – 2019-02-03 (×6): 3 mL via RESPIRATORY_TRACT
  Filled 2019-02-01 (×6): qty 3

## 2019-02-01 MED ORDER — ONDANSETRON HCL 4 MG/2ML IJ SOLN: 4.0000 mg | Freq: Four times a day (QID) | INTRAMUSCULAR | Status: DC | PRN

## 2019-02-01 MED ORDER — GUAIFENESIN ER 600 MG PO TB12
1200.0000 mg | ORAL_TABLET | Freq: Two times a day (BID) | ORAL | Status: DC
  Administered 2019-02-02 – 2019-02-03 (×4): 1200 mg via ORAL
  Filled 2019-02-01 (×5): qty 2

## 2019-02-01 MED ORDER — ENOXAPARIN SODIUM 40 MG/0.4ML ~~LOC~~ SOLN
40.0000 mg | Freq: Every day | SUBCUTANEOUS | Status: DC
  Administered 2019-02-02 – 2019-02-03 (×2): 40 mg via SUBCUTANEOUS
  Filled 2019-02-01 (×2): qty 0.4

## 2019-02-01 NOTE — ED Provider Notes (Signed)
[facility] Emergency Department Provider Note MRN:  [id number]  Arrival date & time: 02/01/19     Chief Complaint   Shortness of Breath   History of Present Illness   Cindy Hicks is a 58 y.o. year-old female with no pertinent past medical history presenting to the ED with chief complaint of shortness of breath.  Sudden onset shortness of breath that began yesterday, was initially mild but is now severe.  Reportedly in severe distress with EMS with significant hypoxia requiring nonrebreather.  Given Solu-Medrol and magnesium in route with good improvement.  Patient denies chest pain.  Endorsing recent persistent cough, denies fever, no abdominal pain, no leg pain or swelling, no personal history of DVT or PE.  Review of Systems  A complete 10 system review of systems was obtained and all systems are negative except as noted in the HPI and PMH.   Patient's Health History    Past Medical History:  Diagnosis Date  . Bronchitis     Past Surgical History:  Procedure Laterality Date  . ANKLE SURGERY Left 2000   fractured ankle, Dr. Jones Bales. [facility]  . CYST EXCISION  2015   cervical, Dr. Georgianne Fick  . FRACTURE SURGERY    . ORIF ANKLE FRACTURE Right 09/18/2015   Procedure: OPEN REDUCTION INTERNAL FIXATION (ORIF) ANKLE FRACTURE;  Surgeon: Thornton Park, MD;  Location: [facility] ORS;  Service: Orthopedics;  Laterality: Right;    No family history on file.  Social History   Socioeconomic History  . Marital status: Single    Spouse name: Not on file  . Number of children: Not on file  . Years of education: Not on file  . Highest education level: Not on file  Occupational History  . Not on file  Social Needs  . Financial resource strain: Not on file  . Food insecurity    Worry: Not on file    Inability: Not on file  . Transportation needs    Medical: Not on file    Non-medical: Not on file  Tobacco Use  . Smoking status: Current Some Day Smoker     Packs/day: 0.25    Types: Cigarettes  . Smokeless tobacco: Never Used  Substance and Sexual Activity  . Alcohol use: Yes    Comment: occ  . Drug use: No  . Sexual activity: Yes  Lifestyle  . Physical activity    Days per week: Not on file    Minutes per session: Not on file  . Stress: Not on file  Relationships  . Social Herbalist on phone: Not on file    Gets together: Not on file    Attends religious service: Not on file    Active member of club or organization: Not on file    Attends meetings of clubs or organizations: Not on file    Relationship status: Not on file  . Intimate partner violence    Fear of current or ex partner: Not on file    Emotionally abused: Not on file    Physically abused: Not on file    Forced sexual activity: Not on file  Other Topics Concern  . Not on file  Social History Narrative  . Not on file     Physical Exam  Vital Signs and Nursing Notes reviewed Vitals:   02/01/19 2130 02/01/19 2200  BP: (!) 166/92 (!) 168/86  Pulse: 100 94  Resp: (!) 28 19  Temp:  SpO2: 95% 95%    CONSTITUTIONAL: Well-appearing, NAD NEURO:  Alert and oriented x 3, no focal deficits EYES:  eyes equal and reactive ENT/NECK:  no LAD, no JVD CARDIO: Tachycardic rate, well-perfused, normal S1 and S2 PULM: Scattered wheezes GI/GU:  normal bowel sounds, non-distended, non-tender MSK/SPINE:  No gross deformities, no edema SKIN:  no rash, atraumatic PSYCH:  Appropriate speech and behavior  Diagnostic and Interventional Summary    EKG Interpretation  Date/Time:  Wednesday February 01 2019 19:20:14 EDT Ventricular Rate:  104 PR Interval:    QRS Duration: 84 QT Interval:  361 QTC Calculation: 475 R Axis:   76 Text Interpretation: Sinus tachycardia Right atrial enlargement Confirmed by Gerlene Fee [phone]) on 02/01/2019 7:32:42 PM      Labs Reviewed  COMPREHENSIVE METABOLIC PANEL - Abnormal; Notable for the following components:      Result  Value   Glucose, Bld 162 (*)    All other components within normal limits  SARS CORONAVIRUS 2 BY RT PCR (HOSPITAL ORDER, [place] [facility])  CBC  D-DIMER, QUANTITATIVE (NOT AT [facility])    DG Chest Port 1 View  Final Result      Medications - No data to display   Procedures  /  Critical Care Procedures  ED Course and Medical Decision Making  I have reviewed the triage vital signs and the nursing notes.  Pertinent labs & imaging results that were available during my care of the patient were reviewed by me and considered in my medical decision making (see below for details).  Considering reactive airway disease, pneumonia, PE, COVID-19.  Work-up is pending.  Cindy Hicks was evaluated in Emergency Department on 02/01/2019 for the symptoms described in the history of present illness. She was evaluated in the context of the global COVID-19 pandemic, which necessitated consideration that the patient might be at risk for infection with the SARS-CoV-2 virus that causes COVID-19. Institutional protocols and algorithms that pertain to the evaluation of patients at risk for COVID-19 are in a state of rapid change based on information released by regulatory bodies including the CDC and federal and state organizations. These policies and algorithms were followed during the patient's care in the ED.  D-dimer is negative, chest x-ray is clear without signs of pneumonia, Covid test is negative as well.  Suspect undiagnosed reactive airway disease admitted to hospital service for further care given her continued oxygen requirement.  Barth Kirks. [place]onia Small, [place] [email or web address]  Final Clinical Impressions(s) / ED Diagnoses     ICD-10-CM   1. Hypoxia  R09.02   2. SOB (shortness of breath)  R06.02 DG Chest [facility] 1 View    DG Chest Port 1 View    ED Discharge Orders    None      Discharge Instructions Discussed with and  Provided to Patient: Discharge Instructions   None       Maudie Flakes, MD 02/01/19 2246

## 2019-02-01 NOTE — ED Notes (Signed)
Please address the current telemetry order prior to transfer of the patient.  Thank you.

## 2019-02-01 NOTE — H&P (Signed)
History and Physical    Cindy Hicks [id number] DOB: 08-15-1960 DOA: 02/01/2019  PCP: Patient, No Pcp Per  Patient coming from: Home via EMS  I have personally briefly reviewed patient's old medical records in [place]  Chief Complaint: Shortness of breath  HPI: Cindy Hicks is a 58 y.o. female with medical history significant for bronchitis and tobacco use who presents to the ED for evaluation of shortness of breath.  Patient reports progressive dyspnea and cough beginning 2 days prior to admission.  Cough was initially nonproductive, now producing clear sputum.  She has had associated musculoskeletal chest wall pain due to frequent coughing.  Symptoms have been worsening during this time period therefore she called EMS for further assistance.  Per ED documentation, on EMS arrival patient appeared to be in tripod position with O2 saturation in the 80s.  She was given DuoNeb treatment, IV Solu-Medrol 125 mg, IV magnesium 2 mg, and placed on nonrebreather on route to the ED.  Patient denies any associated subjective fevers, chills, diaphoresis, abdominal pain, dysuria.  She reports similar symptoms approximately 13 years ago at which time she was told she had bronchitis and was prescribed inhaler treatments.  She feels her symptoms are exacerbated by the cold and rainy weather.  She reports occasional tobacco use of 5-6 cigarettes for many years.  She denies any illicit drug use.  ED Course:  Initial vitals showed BP 175/100, pulse 104, RR 26, temp 98.0 Fahrenheit, SPO2 89% on 15 L O2 via nonrebreather.  She was weaned down to 3 L supplemental O2 via [place].  Labs are notable for WBC 8.9, hemoglobin 13.6, platelets 237,000, sodium 137, potassium 3.8, bicarb 28, BUN 11, creatinine 0.92, D-dimer 0.38.  SARS-CoV-2 test is negative.  Portable chest x-ray was negative for focal consolidation, effusion, edema, or pneumothorax.  The hospitalist service was consulted admit for  further evaluation management.  Review of Systems: All systems reviewed and are negative except as documented in history of present illness above.   Past Medical History:  Diagnosis Date  . Bronchitis     Past Surgical History:  Procedure Laterality Date  . ANKLE SURGERY Left 2000   fractured ankle, Dr. Jones Bales. [facility]  . CYST EXCISION  2015   cervical, Dr. Georgianne Fick  . FRACTURE SURGERY    . ORIF ANKLE FRACTURE Right 09/18/2015   Procedure: OPEN REDUCTION INTERNAL FIXATION (ORIF) ANKLE FRACTURE;  Surgeon: Thornton Park, MD;  Location: [facility] ORS;  Service: Orthopedics;  Laterality: Right;    Social History:  reports that she has been smoking cigarettes. She has been smoking about 0.25 packs per day. She has never used smokeless tobacco. She reports current alcohol use. She reports that she does not use drugs.  Allergies  Allergen Reactions  . Hydrocodone Itching  . Oxycodone Itching    "severe"    No family history on file.   Prior to Admission medications   Medication Sig Start Date End Date Taking? Authorizing Provider  albuterol (PROVENTIL HFA;VENTOLIN HFA) 108 (90 Base) MCG/ACT inhaler Inhale 2 puffs into the lungs every 4 (four) hours as needed. 02/11/16  Yes Marylene Land, NP  ibuprofen (ADVIL) 200 MG tablet Take 400 mg by mouth every 6 (six) hours as needed for moderate pain.   Yes [provider]  aspirin EC 325 MG tablet Take 1 tablet (325 mg total) by mouth 2 (two) times daily. Patient not taking: Reported on 02/01/2019 09/19/15   Thornton Park, MD  azithromycin (ZITHROMAX Z-PAK) 250 MG tablet Take 2 tablets (500 mg) on  Day 1,  followed by 1 tablet (250 mg) once daily on Days 2 through 5. Patient not taking: Reported on 02/01/2019 02/11/16   Marylene Land, NP  benzonatate (TESSALON PERLES) 100 MG capsule Take 1 capsule (100 mg total) by mouth 3 (three) times daily as needed. Patient not taking: Reported on 02/01/2019 02/11/16   Marylene Land,  NP  guaiFENesin-codeine 100-10 MG/5ML syrup Take 5 mLs by mouth at bedtime as needed for cough. Patient not taking: Reported on 02/01/2019 02/11/16   Marylene Land, NP  HYDROcodone-acetaminophen (NORCO) 5-325 MG tablet Take 1-2 tablets by mouth every 4 (four) hours as needed for moderate pain. MAXIMUM TOTAL ACETAMINOPHEN DOSE IS 4000 MG PER DAY Patient not taking: Reported on 02/01/2019 09/19/15   Thornton Park, MD  HYDROcodone-acetaminophen (NORCO/VICODIN) 5-325 MG tablet Take 1-2 tablets by mouth every 4 (four) hours as needed for moderate pain. Patient not taking: Reported on 02/01/2019 09/19/15   Thornton Park, MD  meclizine (ANTIVERT) 25 MG tablet Take 1 tablet (25 mg total) by mouth 3 (three) times daily as needed for dizziness. Patient not taking: Reported on 02/01/2019 05/11/17   Harvest Dark, MD  predniSONE (DELTASONE) 10 MG tablet Start 60 mg po day one, then 50 mg po day two, taper by 10 mg daily until complete. Patient not taking: Reported on 02/01/2019 02/11/16   Marylene Land, NP    Physical Exam: Vitals:   02/01/19 2130 02/01/19 2200 02/01/19 2230 02/01/19 2301  BP: (!) 166/92 (!) 168/86 (!) 169/82 (!) 165/96  Pulse: 100 94 97 (!) 101  Resp: (!) 28 19 (!) 26 (!) 33  Temp:      TempSrc:      SpO2: 95% 95% 94% 97%  Weight:      Height:        Constitutional: Resting in bed with head elevated, NAD, calm, comfortable Eyes: PERRL, lids and conjunctivae normal ENMT: Mucous membranes are moist. Posterior pharynx clear of any exudate or lesions.Normal dentition.  Neck: normal, supple, no masses. Respiratory: Distant breath sounds with expiratory wheezing bilaterally, normal respiratory effort. No accessory muscle use.  Cardiovascular: Regular rate and rhythm, no murmurs / rubs / gallops. No extremity edema. 2+ pedal pulses. Abdomen: no tenderness, no masses palpated. No hepatosplenomegaly. Bowel sounds positive.  Musculoskeletal: no clubbing / cyanosis. No joint  deformity upper and lower extremities. Good ROM, no contractures. Normal muscle tone.  Skin: no rashes, lesions, ulcers. No induration Neurologic: CN 2-12 grossly intact. Sensation intact, Strength 5/5 in all 4.  Psychiatric: Normal judgment and insight. Alert and oriented x 3. Normal mood.     Labs on Admission: I have personally reviewed following labs and imaging studies  CBC: Recent Labs  Lab 02/01/19 2033  WBC 8.9  HGB 13.6  HCT 43.1  MCV 92.1  PLT [id number]   Basic Metabolic Panel: Recent Labs  Lab 02/01/19 2033  NA 137  K 3.8  CL 101  CO2 28  GLUCOSE 162*  BUN 11  CREATININE 0.92  CALCIUM 9.0   GFR: Estimated Creatinine Clearance: 67.2 mL/min (by C-G formula based on SCr of 0.92 mg/dL). Liver Function Tests: Recent Labs  Lab 02/01/19 2033  AST 23  ALT 21  ALKPHOS 86  BILITOT 0.5  PROT 7.6  ALBUMIN 3.8   No results for input(s): LIPASE, AMYLASE in the last 168 hours. No results for input(s): AMMONIA in the last 168 hours. Coagulation Profile: No  results for input(s): INR, PROTIME in the last 168 hours. Cardiac Enzymes: No results for input(s): CKTOTAL, CKMB, CKMBINDEX, TROPONINI in the last 168 hours. BNP (last 3 results) No results for input(s): PROBNP in the last 8760 hours. HbA1C: No results for input(s): HGBA1C in the last 72 hours. CBG: No results for input(s): GLUCAP in the last 168 hours. Lipid Profile: No results for input(s): CHOL, HDL, LDLCALC, TRIG, CHOLHDL, LDLDIRECT in the last 72 hours. Thyroid Function Tests: No results for input(s): TSH, T4TOTAL, FREET4, T3FREE, THYROIDAB in the last 72 hours. Anemia Panel: No results for input(s): VITAMINB12, FOLATE, FERRITIN, TIBC, IRON, RETICCTPCT in the last 72 hours. Urine analysis:    Component Value Date/Time   COLORURINE YELLOW (A) 05/11/2017 1903   APPEARANCEUR CLEAR (A) 05/11/2017 1903   APPEARANCEUR Hazy 11/09/2013 1307   LABSPEC 1.010 05/11/2017 1903   LABSPEC 1.018 11/09/2013 1307    PHURINE 5.0 05/11/2017 1903   GLUCOSEU NEGATIVE 05/11/2017 1903   GLUCOSEU Negative 11/09/2013 1307   HGBUR SMALL (A) 05/11/2017 1903   BILIRUBINUR NEGATIVE 05/11/2017 1903   BILIRUBINUR Negative 11/09/2013 1307   KETONESUR NEGATIVE 05/11/2017 1903   PROTEINUR NEGATIVE 05/11/2017 1903   NITRITE NEGATIVE 05/11/2017 1903   LEUKOCYTESUR NEGATIVE 05/11/2017 1903   LEUKOCYTESUR 2+ 11/09/2013 1307    Radiological Exams on Admission: Dg Chest Port 1 View  Result Date: 02/01/2019 CLINICAL DATA:  58 year old female with shortness of breath. EXAM: PORTABLE CHEST 1 VIEW COMPARISON:  None FINDINGS: No focal consolidation, pleural effusion, or pneumothorax. The cardiac silhouette is within normal limits. No acute osseous pathology. IMPRESSION: No active disease. Electronically Signed   By: Anner Crete M.D.   On: 02/01/2019 19:56    EKG: Independently reviewed. Sinus tachycardia, motion artifact, rate is faster when compared to prior.  Assessment/Plan Principal Problem:   Acute respiratory failure with hypoxia (HCC) Active Problems:   Tobacco use  Cindy Hicks is a 58 y.o. female with medical history significant for bronchitis and tobacco use who is admitted with acute respiratory failure with hypoxia.   Acute respiratory failure with hypoxia: Suspect underlying asthma with exacerbation.  She has been weaned down to 2 L supplemental O2 via [place] and has continued expiratory wheezing on admission. -Start duonebs q6h and albuterol nebs q2h prn -IV Solu-Medrol 40 mg twice daily -Supplemental O2 as needed and wean down as able  Tobacco use: Patient counseled on cessation.  She declines nicotine patch.  DVT prophylaxis: Lovenox Code Status: Full code, confirmed with patient Family Communication: Discussed with patient Disposition Plan: Pending clinical progress Consults called: None Admission status: Observation   Zada Finders MD [facility]  If 7PM-7AM, please contact  night-coverage [email or web address]  02/01/2019, 11:39 PM

## 2019-02-01 NOTE — ED Triage Notes (Signed)
Pt presents to ED via [facility] coming from home. Pt c/o cough and ShoB worsening today. Pt has a hx of chronic bronchitis, no fevers, N/V/D or recent COVID exposures that she's aware of. EMS reports that upon their arrival pt was tripoding and O2 sats in the 80's. EMS gave a duoneb on scene outside of pts residence, 54 G IV in the [facility] and gave 125 mg of solu-medrol and 2g of mag with a lot of improvement. Lung sounds wheezing with mild rhonchi. Pt sat in 98 on NRB currently.

## 2019-02-02 ENCOUNTER — Other Ambulatory Visit: Payer: Self-pay

## 2019-02-02 ENCOUNTER — Encounter (HOSPITAL_COMMUNITY): Payer: Self-pay

## 2019-02-02 DIAGNOSIS — J9601 Acute respiratory failure with hypoxia: Secondary | ICD-10-CM | POA: Diagnosis not present

## 2019-02-02 DIAGNOSIS — R0602 Shortness of breath: Secondary | ICD-10-CM | POA: Diagnosis present

## 2019-02-02 DIAGNOSIS — Z720 Tobacco use: Secondary | ICD-10-CM

## 2019-02-02 DIAGNOSIS — J441 Chronic obstructive pulmonary disease with (acute) exacerbation: Secondary | ICD-10-CM | POA: Diagnosis not present

## 2019-02-02 DIAGNOSIS — Z20828 Contact with and (suspected) exposure to other viral communicable diseases: Secondary | ICD-10-CM | POA: Diagnosis present

## 2019-02-02 DIAGNOSIS — R739 Hyperglycemia, unspecified: Secondary | ICD-10-CM | POA: Diagnosis present

## 2019-02-02 DIAGNOSIS — T380X5A Adverse effect of glucocorticoids and synthetic analogues, initial encounter: Secondary | ICD-10-CM | POA: Diagnosis present

## 2019-02-02 DIAGNOSIS — Z885 Allergy status to narcotic agent status: Secondary | ICD-10-CM | POA: Diagnosis not present

## 2019-02-02 DIAGNOSIS — F17210 Nicotine dependence, cigarettes, uncomplicated: Secondary | ICD-10-CM | POA: Diagnosis present

## 2019-02-02 LAB — CBC
HCT: 40.5 % (ref 36.0–46.0)
Hemoglobin: 13.1 g/dL (ref 12.0–15.0)
MCH: 29.5 pg (ref 26.0–34.0)
MCHC: 32.3 g/dL (ref 30.0–36.0)
MCV: 91.2 fL (ref 80.0–100.0)
Platelets: 220 10*3/uL (ref 150–400)
RBC: 4.44 MIL/uL (ref 3.87–5.11)
RDW: 12.8 % (ref 11.5–15.5)
WBC: 7.5 10*3/uL (ref 4.0–10.5)
nRBC: 0 % (ref 0.0–0.2)

## 2019-02-02 LAB — BASIC METABOLIC PANEL
Anion gap: 12 (ref 5–15)
BUN: 12 mg/dL (ref 6–20)
CO2: 22 mmol/L (ref 22–32)
Calcium: 9 mg/dL (ref 8.9–10.3)
Chloride: 102 mmol/L (ref 98–111)
Creatinine, Ser: 0.86 mg/dL (ref 0.44–1.00)
GFR calc Af Amer: >60 mL/min (ref >60)
GFR calc non Af Amer: >60 mL/min (ref >60)
Glucose, Bld: 241 mg/dL — ABNORMAL HIGH (ref 70–99)
Potassium: 3.5 mmol/L (ref 3.5–5.1)
Sodium: 136 mmol/L (ref 135–145)

## 2019-02-02 LAB — HEMOGLOBIN A1C
Hgb A1c MFr Bld: 5.8 % — ABNORMAL HIGH (ref 4.8–5.6)
Mean Plasma Glucose: 119.76 mg/dL

## 2019-02-02 LAB — GLUCOSE, CAPILLARY: Glucose-Capillary: 177 mg/dL — ABNORMAL HIGH (ref 70–99)

## 2019-02-02 LAB — HIV ANTIBODY (ROUTINE TESTING W REFLEX): HIV Screen 4th Generation wRfx: NONREACTIVE

## 2019-02-02 MED ORDER — GUAIFENESIN-DM 100-10 MG/5ML PO SYRP
5.0000 mL | ORAL_SOLUTION | ORAL | Status: DC | PRN
  Administered 2019-02-02 (×3): 5 mL via ORAL
  Filled 2019-02-02 (×4): qty 10

## 2019-02-02 MED ORDER — AZITHROMYCIN 250 MG PO TABS
500.0000 mg | ORAL_TABLET | Freq: Every day | ORAL | Status: AC
  Administered 2019-02-02: 22:00:00 500 mg via ORAL
  Filled 2019-02-02: qty 2

## 2019-02-02 MED ORDER — INSULIN ASPART 100 UNIT/ML ~~LOC~~ SOLN
0.0000 [IU] | Freq: Three times a day (TID) | SUBCUTANEOUS | Status: DC
  Administered 2019-02-03: 09:00:00 3 [IU] via SUBCUTANEOUS

## 2019-02-02 MED ORDER — MENTHOL 3 MG MT LOZG
1.0000 | LOZENGE | OROMUCOSAL | Status: DC | PRN
  Administered 2019-02-02: 10:00:00 3 mg via ORAL
  Filled 2019-02-02: qty 9

## 2019-02-02 MED ORDER — AZITHROMYCIN 250 MG PO TABS
250.0000 mg | ORAL_TABLET | Freq: Every day | ORAL | Status: DC
  Administered 2019-02-03: 10:00:00 250 mg via ORAL
  Filled 2019-02-02: qty 1

## 2019-02-02 MED ORDER — SIMETHICONE 80 MG PO CHEW
80.0000 mg | CHEWABLE_TABLET | Freq: Four times a day (QID) | ORAL | Status: DC | PRN
  Administered 2019-02-02: 23:00:00 80 mg via ORAL
  Filled 2019-02-02: qty 1

## 2019-02-02 MED ORDER — NICOTINE 7 MG/24HR TD PT24
7.0000 mg | MEDICATED_PATCH | Freq: Every day | TRANSDERMAL | Status: DC
  Administered 2019-02-02 – 2019-02-03 (×2): 7 mg via TRANSDERMAL
  Filled 2019-02-02 (×2): qty 1

## 2019-02-02 MED ORDER — INSULIN ASPART 100 UNIT/ML ~~LOC~~ SOLN: 0.0000 [IU] | Freq: Every day | SUBCUTANEOUS | Status: DC

## 2019-02-02 NOTE — Progress Notes (Addendum)
PROGRESS NOTE    Cindy Hicks  [id number] DOB: 05/19/1960 DOA: 02/01/2019 PCP: Patient, No Pcp Per    Brief Narrative:  58 year old female admitted to the hospital with worsening shortness of breath and wheezing.  She has a history of tobacco use.  She is admitted to the hospital with acute respiratory failure secondary to COPD exacerbation.  Started on steroids, bronchodilators and antibiotics.   Assessment & Plan:   Principal Problem:   Acute respiratory failure with hypoxia (HCC) Active Problems:   Tobacco use   COPD exacerbation ([place])   1. Acute respiratory failure with hypoxia.  Likely related to underlying COPD exacerbation.  Patient does have a history of chronic tobacco use.  She was short of breath and wheezing on admission.  Continue on intravenous steroids and bronchodilators.  Add a course of azithromycin.  Continue pulmonary hygiene.  Wean off oxygen as tolerated. 2. COPD exacerbation.  Treated with steroids, bronchodilators and antibiotics.  Would likely benefit from pulmonary function tests as an outpatient. 3. Tobacco use.  Started on nicotine patch. 4. Hyperglycemia.  Related to steroids.  Start on sliding scale insulin.  Check A1c.   DVT prophylaxis: Lovenox Code Status: Full code Family Communication: None present Disposition Plan: Discharge home once respiratory status has improved and she is able to ambulate without shortness of breath.   Consultants:     Procedures:     Antimicrobials:   Azithromycin 10/29 >   Subjective: Still feels short of breath, wheezing and coughing.  Does not feel back to baseline yet.  Still requiring supplemental oxygen  Objective: Vitals:   02/02/19 0915 02/02/19 1406 02/02/19 1628 02/02/19 1940  BP:  (!) 158/90    Pulse:  98    Resp:  16    Temp:  98.4 F (36.9 C)    TempSrc:  Oral    SpO2: 98% 95% 93% 100%  Weight:      Height:        Intake/Output Summary (Last 24 hours) at 02/02/2019 2018  Last data filed at 02/02/2019 1632 Gross per 24 hour  Intake 900 ml  Output 150 ml  Net 750 ml   Filed Weights   02/01/19 1923  Weight: 77.1 kg    Examination:  General exam: Appears calm and comfortable  Respiratory system: Lateral rhonchi and wheezing.  Mild increased respiratory effort. Cardiovascular system: S1 & S2 heard, RRR. No JVD, murmurs, rubs, gallops or clicks. No pedal edema. Gastrointestinal system: Abdomen is nondistended, soft and nontender. No organomegaly or masses felt. Normal bowel sounds heard. Central nervous system: Alert and oriented. No focal neurological deficits. Extremities: Symmetric 5 x 5 power. Skin: No rashes, lesions or ulcers Psychiatry: Judgement and insight appear normal. Mood & affect appropriate.     Data Reviewed: I have personally reviewed following labs and imaging studies  CBC: Recent Labs  Lab 02/01/19 2033 02/02/19 0306  WBC 8.9 7.5  HGB 13.6 13.1  HCT 43.1 40.5  MCV 92.1 91.2  PLT 237 [id number]   Basic Metabolic Panel: Recent Labs  Lab 02/01/19 2033 02/02/19 0306  NA 137 136  K 3.8 3.5  CL 101 102  CO2 28 22  GLUCOSE 162* 241*  BUN 11 12  CREATININE 0.92 0.86  CALCIUM 9.0 9.0   GFR: Estimated Creatinine Clearance: 71.8 mL/min (by C-G formula based on SCr of 0.86 mg/dL). Liver Function Tests: Recent Labs  Lab 02/01/19 2033  AST 23  ALT 21  ALKPHOS 86  BILITOT 0.5  PROT 7.6  ALBUMIN 3.8   No results for input(s): LIPASE, AMYLASE in the last 168 hours. No results for input(s): AMMONIA in the last 168 hours. Coagulation Profile: No results for input(s): INR, PROTIME in the last 168 hours. Cardiac Enzymes: No results for input(s): CKTOTAL, CKMB, CKMBINDEX, TROPONINI in the last 168 hours. BNP (last 3 results) No results for input(s): PROBNP in the last 8760 hours. HbA1C: No results for input(s): HGBA1C in the last 72 hours. CBG: No results for input(s): GLUCAP in the last 168 hours. Lipid Profile: No  results for input(s): CHOL, HDL, LDLCALC, TRIG, CHOLHDL, LDLDIRECT in the last 72 hours. Thyroid Function Tests: No results for input(s): TSH, T4TOTAL, FREET4, T3FREE, THYROIDAB in the last 72 hours. Anemia Panel: No results for input(s): VITAMINB12, FOLATE, FERRITIN, TIBC, IRON, RETICCTPCT in the last 72 hours. Sepsis Labs: No results for input(s): PROCALCITON, LATICACIDVEN in the last 168 hours.  Recent Results (from the past 240 hour(s))  SARS Coronavirus 2 by RT PCR (hospital order, performed in [facility] hospital lab) Nasopharyngeal Nasopharyngeal Swab     Status: None   Collection Time: 02/01/19  8:33 PM   Specimen: Nasopharyngeal Swab  Result Value Ref Range Status   SARS Coronavirus 2 NEGATIVE NEGATIVE Final    Comment: (NOTE) If result is NEGATIVE SARS-CoV-2 target nucleic acids are NOT DETECTED. The SARS-CoV-2 RNA is generally detectable in upper and lower  respiratory specimens during the acute phase of infection. The lowest  concentration of SARS-CoV-2 viral copies this assay can detect is 250  copies / mL. A negative result does not preclude SARS-CoV-2 infection  and should not be used as the sole basis for treatment or other  patient management decisions.  A negative result may occur with  improper specimen collection / handling, submission of specimen other  than nasopharyngeal swab, presence of viral mutation(s) within the  areas targeted by this assay, and inadequate number of viral copies  (<250 copies / mL). A negative result must be combined with clinical  observations, patient history, and epidemiological information. If result is POSITIVE SARS-CoV-2 target nucleic acids are DETECTED. The SARS-CoV-2 RNA is generally detectable in upper and lower  respiratory specimens dur ing the acute phase of infection.  Positive  results are indicative of active infection with SARS-CoV-2.  Clinical  correlation with patient history and other diagnostic information is   necessary to determine patient infection status.  Positive results do  not rule out bacterial infection or co-infection with other viruses. If result is PRESUMPTIVE POSTIVE SARS-CoV-2 nucleic acids MAY BE PRESENT.   A presumptive positive result was obtained on the submitted specimen  and confirmed on repeat testing.  While 2019 novel coronavirus  (SARS-CoV-2) nucleic acids may be present in the submitted sample  additional confirmatory testing may be necessary for epidemiological  and / or clinical management purposes  to differentiate between  SARS-CoV-2 and other Sarbecovirus currently known to infect humans.  If clinically indicated additional testing with an alternate test  methodology [phone]) is advised. The SARS-CoV-2 RNA is generally  detectable in upper and lower respiratory sp ecimens during the acute  phase of infection. The expected result is Negative. Fact Sheet for Patients:  StrictlyIdeas.no Fact Sheet for Healthcare Providers: BankingDealers.co.za This test is not yet approved or cleared by the [place] FDA and has been authorized for detection and/or diagnosis of SARS-CoV-2 by FDA under an Emergency Use Authorization (EUA).  This EUA will remain in effect (meaning this test can  be used) for the duration of the COVID-19 declaration under Section 564(b)(1) of the Act, 21 U.S.C. section 360bbb-3(b)(1), unless the authorization is terminated or revoked sooner. Performed at [facility], [place] [address] [place],  [address]          Radiology Studies: Dg Chest Port 1 View  Result Date: 02/01/2019 CLINICAL DATA:  58 year old female with shortness of breath. EXAM: PORTABLE CHEST 1 VIEW COMPARISON:  None FINDINGS: No focal consolidation, pleural effusion, or pneumothorax. The cardiac silhouette is within normal limits. No acute osseous pathology. IMPRESSION: No active disease. Electronically  Signed   By: Anner Crete M.D.   On: 02/01/2019 19:56        Scheduled Meds: . enoxaparin (LOVENOX) injection  40 mg Subcutaneous Daily  . guaiFENesin  1,200 mg Oral BID  . ipratropium-albuterol  3 mL Nebulization Q6H  . methylPREDNISolone (SOLU-MEDROL) injection  40 mg Intravenous Q12H  . nicotine  7 mg Transdermal Daily   Continuous Infusions:   LOS: 0 days    Time spent: 33mins    Kathie Dike, MD [facility]   If 7PM-7AM, please contact night-coverage [email or web address]  02/02/2019, 8:18 PM

## 2019-02-02 NOTE — Progress Notes (Addendum)
Kathie Dike, MD was paged regarding the pt's request for cough drops and pain meds d/t shoulder, back, side, and chest pain d/t coughing per pt.   MD gave verbal orders for cepacol lozengers. I will continue to monitor.

## 2019-02-02 NOTE — Progress Notes (Signed)
Pt requested Simethicone, On call is paged and orders received. Also notified of patients HR between 110 - 115. Denies chest pain, no acute changes noted.

## 2019-02-03 DIAGNOSIS — J441 Chronic obstructive pulmonary disease with (acute) exacerbation: Secondary | ICD-10-CM | POA: Diagnosis not present

## 2019-02-03 DIAGNOSIS — Z720 Tobacco use: Secondary | ICD-10-CM | POA: Diagnosis not present

## 2019-02-03 DIAGNOSIS — J9601 Acute respiratory failure with hypoxia: Secondary | ICD-10-CM | POA: Diagnosis not present

## 2019-02-03 LAB — GLUCOSE, CAPILLARY: Glucose-Capillary: 176 mg/dL — ABNORMAL HIGH (ref 70–99)

## 2019-02-03 MED ORDER — AZITHROMYCIN 250 MG PO TABS: 250.0000 mg | ORAL_TABLET | Freq: Every day | ORAL | 0 refills | Status: DC

## 2019-02-03 MED ORDER — PREDNISONE 10 MG PO TABS: ORAL_TABLET | ORAL | 0 refills | Status: DC

## 2019-02-03 MED ORDER — ALBUTEROL SULFATE HFA 108 (90 BASE) MCG/ACT IN AERS: 2.0000 | INHALATION_SPRAY | RESPIRATORY_TRACT | 0 refills | Status: DC | PRN

## 2019-02-03 MED ORDER — IPRATROPIUM-ALBUTEROL 0.5-2.5 (3) MG/3ML IN SOLN: 3.0000 mL | Freq: Three times a day (TID) | RESPIRATORY_TRACT | Status: DC

## 2019-02-03 MED ORDER — NICOTINE 7 MG/24HR TD PT24: 7.0000 mg | MEDICATED_PATCH | Freq: Every day | TRANSDERMAL | 0 refills | Status: DC

## 2019-02-03 NOTE — Discharge Summary (Signed)
Physician Discharge Summary  Cindy Hicks [id number] DOB: Jul 24, 1960 DOA: 02/01/2019  PCP: Cindy Hicks, No Pcp Per  Admit date: 02/01/2019 Discharge date: 02/03/2019  Admitted From: Home Disposition: Home  Recommendations for Outpatient Follow-up:  1. Follow up with PCP in 1-2 weeks 2. Please obtain BMP/CBC in one week 3. Cindy Hicks would benefit from outpatient PFTs  Home Health: Equipment/Devices:  Discharge Condition: Stable CODE STATUS: Full code Diet recommendation: Heart healthy  Brief/Interim Summary: 58 year old female admitted to the hospital with worsening shortness of breath and wheezing.  She does have a history of tobacco use.  She was noted to have acute respiratory failure with hypoxia secondary to COPD exacerbation.  She was treated with intravenous steroids, bronchodilators and antibiotics.  Overall respiratory status has improved.  Wheezing has resolved and she is breathing comfortably on room air.  She is able to ambulate without difficulty.  Steroids have been transitioned to prednisone taper.  She is also been prescribed a course of azithromycin.  She will be provided with an albuterol inhaler.  She will also receive a nicotine patch on discharge.  She was noted to be hyperglycemic in the hospital, but A1c was noted to be 5.8.  Suspect her hypoglycemia was related to steroids.   Discharge Diagnoses:  Principal Problem:   Acute respiratory failure with hypoxia ([place]) Active Problems:   Tobacco use   COPD exacerbation [facility])    Discharge Instructions  Discharge Instructions    Diet - low sodium heart healthy   Complete by: As directed    Increase activity slowly   Complete by: As directed      Allergies as of 02/03/2019      Reactions   Hydrocodone Itching   Oxycodone Itching   "severe"      Medication List    TAKE these medications   albuterol 108 (90 Base) MCG/ACT inhaler Commonly known as: VENTOLIN HFA Inhale 2 puffs into the lungs every 4  (four) hours as needed.   azithromycin 250 MG tablet Commonly known as: ZITHROMAX Take 1 tablet (250 mg total) by mouth daily.   ibuprofen 200 MG tablet Commonly known as: ADVIL Take 400 mg by mouth every 6 (six) hours as needed for moderate pain.   nicotine 7 mg/24hr patch Commonly known as: NICODERM CQ - dosed in mg/24 hr Place 1 patch (7 mg total) onto the skin daily. Start taking on: February 04, 2019   predniSONE 10 MG tablet Commonly known as: DELTASONE Take 40mg  po daily for 2 days then 30mg  daily for 2 days then 20mg  daily for 2 days then 10mg  daily for 2 days then stop       Allergies  Allergen Reactions  . Hydrocodone Itching  . Oxycodone Itching    "severe"    Consultations:      Procedures/Studies: Dg Chest Port 1 View  Result Date: 02/01/2019 CLINICAL DATA:  58 year old female with shortness of breath. EXAM: PORTABLE CHEST 1 VIEW COMPARISON:  None FINDINGS: No focal consolidation, pleural effusion, or pneumothorax. The cardiac silhouette is within normal limits. No acute osseous pathology. IMPRESSION: No active disease. Electronically Signed   By: Anner Crete M.D.   On: 02/01/2019 19:56       Subjective: Feeling better.  Shortness of breath has resolved.  Wants to go home.  Discharge Exam: Vitals:   02/03/19 0535 02/03/19 0626 02/03/19 0839 02/03/19 0947  BP: (!) 156/95   (!) 161/86  Pulse: 88  96 94  Resp: 14  18 15  Temp: 97.6 F (36.4 C)   98.1 F (36.7 C)  TempSrc: Oral     SpO2: 100% 97% 99% 97%  Weight:      Height:        General: Pt is alert, awake, not in acute distress Cardiovascular: RRR, S1/S2 +, no rubs, no gallops Respiratory: CTA bilaterally, no wheezing, no rhonchi Abdominal: Soft, NT, ND, bowel sounds + Extremities: no edema, no cyanosis    The results of significant diagnostics from this hospitalization (including imaging, microbiology, ancillary and laboratory) are listed below for reference.      Microbiology: Recent Results (from the past 240 hour(s))  SARS Coronavirus 2 by RT PCR (hospital order, performed in [facility] hospital lab) Nasopharyngeal Nasopharyngeal Swab     Status: None   Collection Time: 02/01/19  8:33 PM   Specimen: Nasopharyngeal Swab  Result Value Ref Range Status   SARS Coronavirus 2 NEGATIVE NEGATIVE Final    Comment: (NOTE) If result is NEGATIVE SARS-CoV-2 target nucleic acids are NOT DETECTED. The SARS-CoV-2 RNA is generally detectable in upper and lower  respiratory specimens during the acute phase of infection. The lowest  concentration of SARS-CoV-2 viral copies this assay can detect is 250  copies / mL. A negative result does not preclude SARS-CoV-2 infection  and should not be used as the sole basis for treatment or other  Cindy Hicks management decisions.  A negative result may occur with  improper specimen collection / handling, submission of specimen other  than nasopharyngeal swab, presence of viral mutation(s) within the  areas targeted by this assay, and inadequate number of viral copies  (<250 copies / mL). A negative result must be combined with clinical  observations, Cindy Hicks history, and epidemiological information. If result is POSITIVE SARS-CoV-2 target nucleic acids are DETECTED. The SARS-CoV-2 RNA is generally detectable in upper and lower  respiratory specimens dur ing the acute phase of infection.  Positive  results are indicative of active infection with SARS-CoV-2.  Clinical  correlation with Cindy Hicks history and other diagnostic information is  necessary to determine Cindy Hicks infection status.  Positive results do  not rule out bacterial infection or co-infection with other viruses. If result is PRESUMPTIVE POSTIVE SARS-CoV-2 nucleic acids MAY BE PRESENT.   A presumptive positive result was obtained on the submitted specimen  and confirmed on repeat testing.  While 2019 novel coronavirus  (SARS-CoV-2) nucleic acids may be  present in the submitted sample  additional confirmatory testing may be necessary for epidemiological  and / or clinical management purposes  to differentiate between  SARS-CoV-2 and other Sarbecovirus currently known to infect humans.  If clinically indicated additional testing with an alternate test  methodology [phone]) is advised. The SARS-CoV-2 RNA is generally  detectable in upper and lower respiratory sp ecimens during the acute  phase of infection. The expected result is Negative. Fact Sheet for Patients:  StrictlyIdeas.no Fact Sheet for Healthcare Providers: BankingDealers.co.za This test is not yet approved or cleared by the [place] FDA and has been authorized for detection and/or diagnosis of SARS-CoV-2 by FDA under an Emergency Use Authorization (EUA).  This EUA will remain in effect (meaning this test can be used) for the duration of the COVID-19 declaration under Section 564(b)(1) of the Act, 21 U.S.C. section 360bbb-3(b)(1), unless the authorization is terminated or revoked sooner. Performed at [facility], [place] [address], [place], [place] [address]      Labs: BNP (last 3 results) No results for input(s): BNP in  the last 8760 hours. Basic Metabolic Panel: Recent Labs  Lab 02/01/19 2033 02/02/19 0306  NA 137 136  K 3.8 3.5  CL 101 102  CO2 28 22  GLUCOSE 162* 241*  BUN 11 12  CREATININE 0.92 0.86  CALCIUM 9.0 9.0   Liver Function Tests: Recent Labs  Lab 02/01/19 2033  AST 23  ALT 21  ALKPHOS 86  BILITOT 0.5  PROT 7.6  ALBUMIN 3.8   No results for input(s): LIPASE, AMYLASE in the last 168 hours. No results for input(s): AMMONIA in the last 168 hours. CBC: Recent Labs  Lab 02/01/19 2033 02/02/19 0306  WBC 8.9 7.5  HGB 13.6 13.1  HCT 43.1 40.5  MCV 92.1 91.2  PLT 237 220   Cardiac Enzymes: No results for input(s): CKTOTAL, CKMB, CKMBINDEX, TROPONINI in the last 168  hours. BNP: Invalid input(s): POCBNP CBG: Recent Labs  Lab 02/02/19 2152 02/03/19 0807  GLUCAP 177* 176*   D-Dimer Recent Labs    02/01/19 2033  DDIMER 0.38   Hgb A1c Recent Labs    02/02/19 2025  HGBA1C 5.8*   Lipid Profile No results for input(s): CHOL, HDL, LDLCALC, TRIG, CHOLHDL, LDLDIRECT in the last 72 hours. Thyroid function studies No results for input(s): TSH, T4TOTAL, T3FREE, THYROIDAB in the last 72 hours.  Invalid input(s): FREET3 Anemia work up No results for input(s): VITAMINB12, FOLATE, FERRITIN, TIBC, IRON, RETICCTPCT in the last 72 hours. Urinalysis    Component Value Date/Time   COLORURINE YELLOW (A) 05/11/2017 1903   APPEARANCEUR CLEAR (A) 05/11/2017 1903   APPEARANCEUR Hazy 11/09/2013 1307   LABSPEC 1.010 05/11/2017 1903   LABSPEC 1.018 11/09/2013 1307   PHURINE 5.0 05/11/2017 1903   GLUCOSEU NEGATIVE 05/11/2017 1903   GLUCOSEU Negative 11/09/2013 1307   HGBUR SMALL (A) 05/11/2017 1903   BILIRUBINUR NEGATIVE 05/11/2017 1903   BILIRUBINUR Negative 11/09/2013 1307   [place] 05/11/2017 1903   PROTEINUR NEGATIVE 05/11/2017 1903   NITRITE NEGATIVE 05/11/2017 1903   LEUKOCYTESUR NEGATIVE 05/11/2017 1903   LEUKOCYTESUR 2+ 11/09/2013 1307   Sepsis Labs Invalid input(s): PROCALCITONIN,  WBC,  LACTICIDVEN Microbiology Recent Results (from the past 240 hour(s))  SARS Coronavirus 2 by RT PCR (hospital order, performed in [place] hospital lab) Nasopharyngeal Nasopharyngeal Swab     Status: None   Collection Time: 02/01/19  8:33 PM   Specimen: Nasopharyngeal Swab  Result Value Ref Range Status   SARS Coronavirus 2 NEGATIVE NEGATIVE Final    Comment: (NOTE) If result is NEGATIVE SARS-CoV-2 target nucleic acids are NOT DETECTED. The SARS-CoV-2 RNA is generally detectable in upper and lower  respiratory specimens during the acute phase of infection. The lowest  concentration of SARS-CoV-2 viral copies this assay can detect is 250   copies / mL. A negative result does not preclude SARS-CoV-2 infection  and should not be used as the sole basis for treatment or other  Cindy Hicks management decisions.  A negative result may occur with  improper specimen collection / handling, submission of specimen other  than nasopharyngeal swab, presence of viral mutation(s) within the  areas targeted by this assay, and inadequate number of viral copies  (<250 copies / mL). A negative result must be combined with clinical  observations, Cindy Hicks history, and epidemiological information. If result is POSITIVE SARS-CoV-2 target nucleic acids are DETECTED. The SARS-CoV-2 RNA is generally detectable in upper and lower  respiratory specimens dur ing the acute phase of infection.  Positive  results are indicative of active  infection with SARS-CoV-2.  Clinical  correlation with Cindy Hicks history and other diagnostic information is  necessary to determine Cindy Hicks infection status.  Positive results do  not rule out bacterial infection or co-infection with other viruses. If result is PRESUMPTIVE POSTIVE SARS-CoV-2 nucleic acids MAY BE PRESENT.   A presumptive positive result was obtained on the submitted specimen  and confirmed on repeat testing.  While 2019 novel coronavirus  (SARS-CoV-2) nucleic acids may be present in the submitted sample  additional confirmatory testing may be necessary for epidemiological  and / or clinical management purposes  to differentiate between  SARS-CoV-2 and other Sarbecovirus currently known to infect humans.  If clinically indicated additional testing with an alternate test  methodology [phone]) is advised. The SARS-CoV-2 RNA is generally  detectable in upper and lower respiratory sp ecimens during the acute  phase of infection. The expected result is Negative. Fact Sheet for Patients:  StrictlyIdeas.no Fact Sheet for Healthcare  Providers: BankingDealers.co.za This test is not yet approved or cleared by the [place] FDA and has been authorized for detection and/or diagnosis of SARS-CoV-2 by FDA under an Emergency Use Authorization (EUA).  This EUA will remain in effect (meaning this test can be used) for the duration of the COVID-19 declaration under Section 564(b)(1) of the Act, 21 U.S.C. section 360bbb-3(b)(1), unless the authorization is terminated or revoked sooner. Performed at [facility], [place] [address] [place], [place] [address]      Time coordinating discharge: 85mins  SIGNED:   Kathie Dike, MD  [facility] 02/03/2019, 9:58 PM   If 7PM-7AM, please contact night-coverage [email or web address]

## 2019-02-03 NOTE — Progress Notes (Signed)
Pt is weaned to 1 l of O2  via [place], saturation is  above 95%. Tolerating well. Pt not in distress. Monitoring closely.

## 2019-02-03 NOTE — Progress Notes (Signed)
Reviewed questions with Curt Bears, off going RN and patient.

## 2019-02-03 NOTE — Progress Notes (Addendum)
Received report from [place], [place].  Patient cleared for discharge.  Awaiting ride.

## 2019-02-03 NOTE — Plan of Care (Signed)

## 2019-04-01 IMAGING — CR DG HAND 2V*L*
2 series · 2 of 2 positions shown · non-contrast
Comparison: None

CLINICAL DATA: Lost balance and fell today, LEFT hand pain and
swelling

EXAM:
LEFT HAND - 2 VIEW

[hand ap]
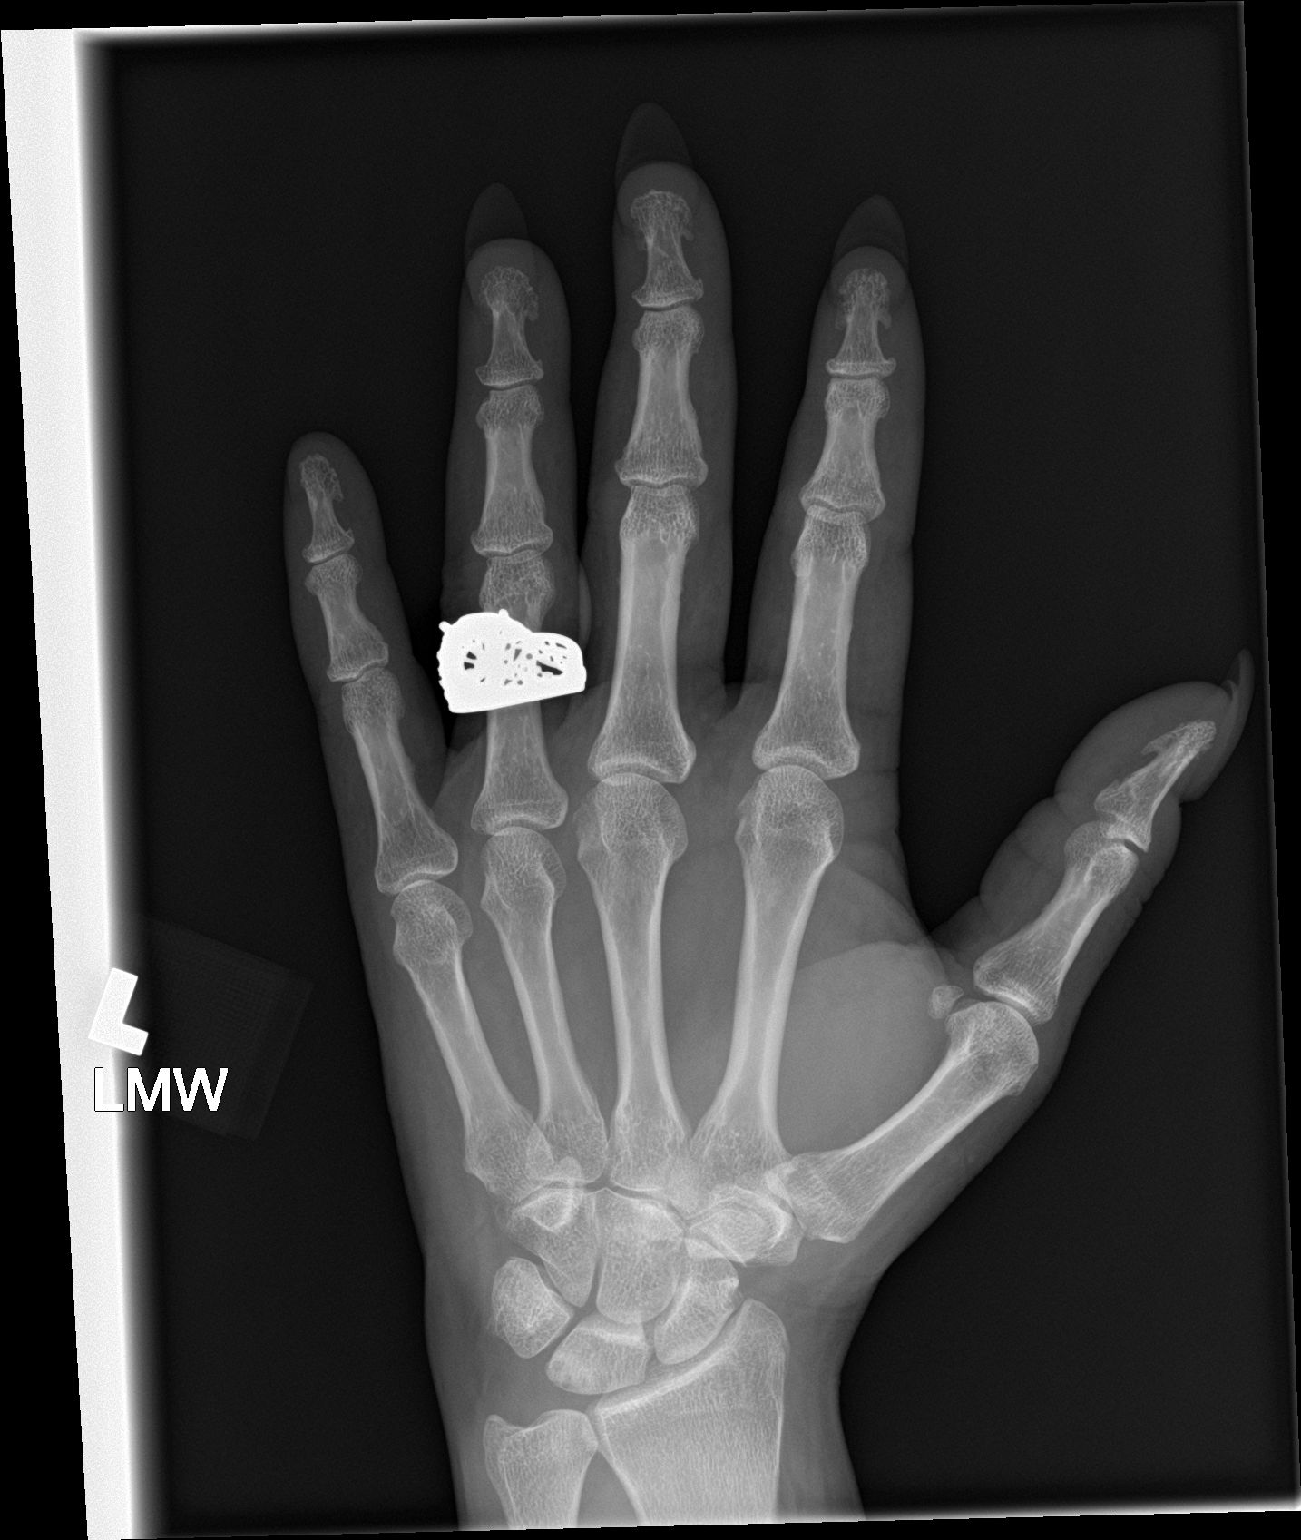

[hand lat]
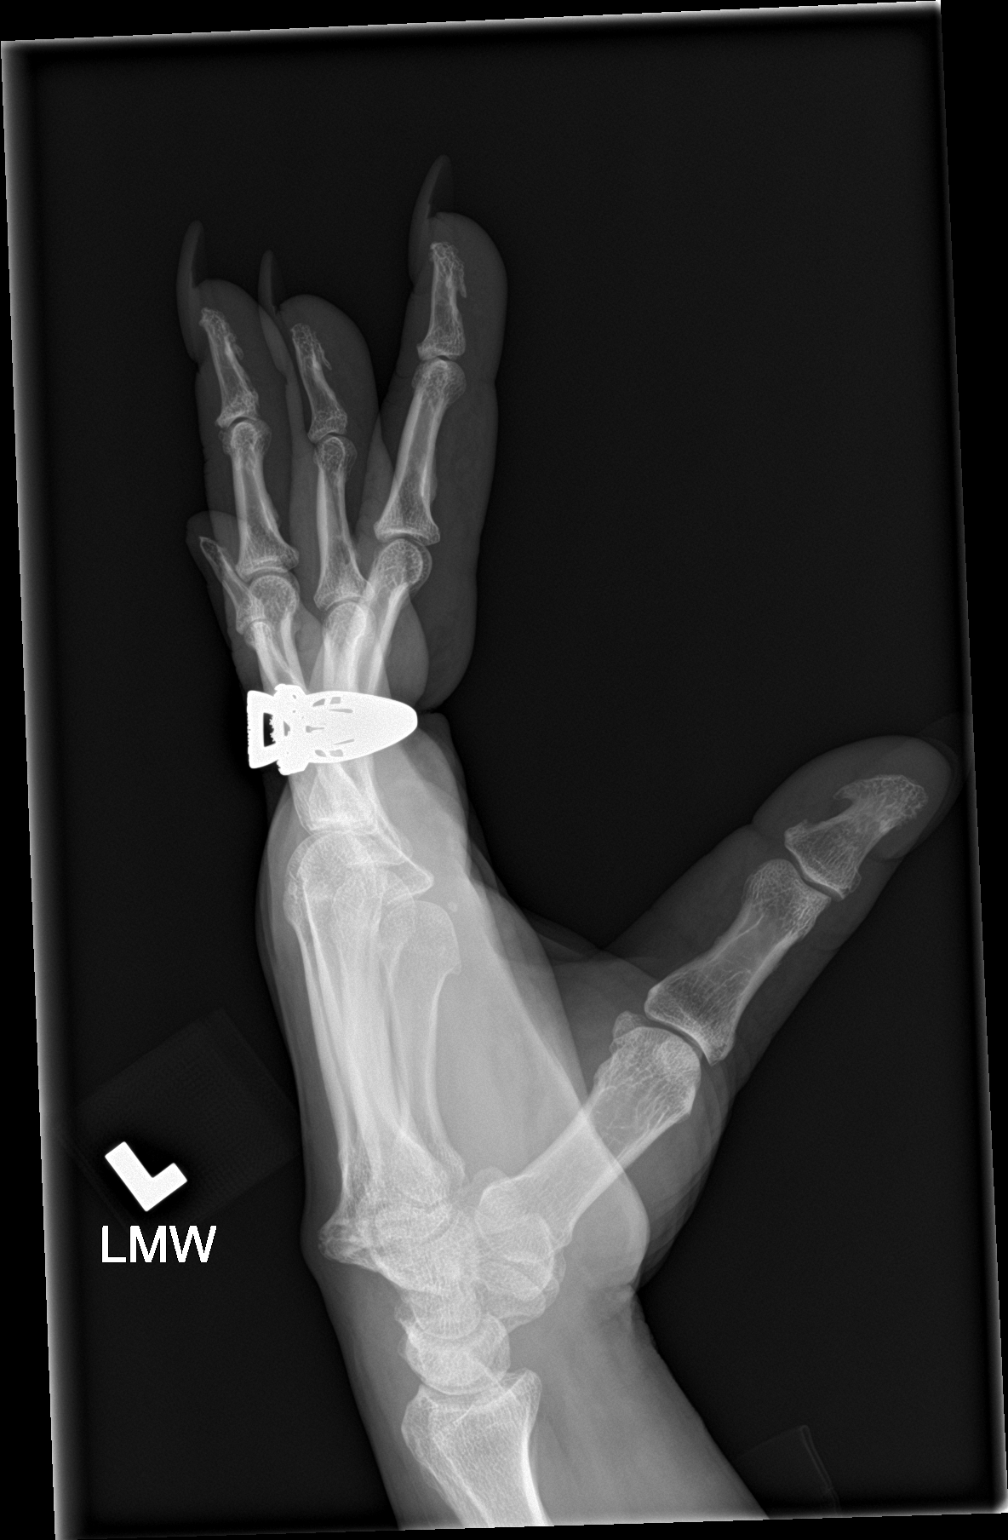

[2 of 2 positions shown; findings below may reference images not displayed]

FINDINGS: Artifact from ring at proximal phalanx ring finger, patient unable
to remove due to swelling.

Mild diffuse osseous demineralization.

Joint spaces preserved.

Degenerative changes/spur formation at the dorsal margin of the CMC
joints on lateral view.

Mild degenerative changes at the proximal lunate.

No acute fracture, dislocation, or bone destruction.
IMPRESSION: No acute osseous abnormalities.

Degenerative changes at dorsal margin of with CMC joints on lateral
view.

## 2020-06-26 ENCOUNTER — Ambulatory Visit
Admission: EM | Admit: 2020-06-26 | Discharge: 2020-06-26 | Disposition: A | Discharge status: Home / Self Care | Payer: 59 | Attending: Emergency Medicine | Admitting: Emergency Medicine

## 2020-06-26 ENCOUNTER — Other Ambulatory Visit: Payer: Self-pay

## 2020-06-26 DIAGNOSIS — J42 Unspecified chronic bronchitis: Secondary | ICD-10-CM | POA: Diagnosis not present

## 2020-06-26 DIAGNOSIS — Z760 Encounter for issue of repeat prescription: Secondary | ICD-10-CM

## 2020-06-26 MED ORDER — ALBUTEROL SULFATE HFA 108 (90 BASE) MCG/ACT IN AERS: 2.0000 | INHALATION_SPRAY | RESPIRATORY_TRACT | 0 refills | Status: DC | PRN

## 2020-06-26 MED ORDER — BUDESONIDE-FORMOTEROL FUMARATE 160-4.5 MCG/ACT IN AERO: 2.0000 | INHALATION_SPRAY | Freq: Two times a day (BID) | RESPIRATORY_TRACT | 0 refills | Status: DC

## 2020-06-26 NOTE — ED Triage Notes (Signed)
Pt states feels like her bronchitis is starting and needs an inhaler. Denies sx's at this time but states feels tightness at times to center chest.

## 2020-06-26 NOTE — Discharge Instructions (Signed)
Symbicort and albuterol inhalers refilled Continue albuterol inhaler as needed, Symbicort twice daily May start daily cetirizine/Zyrtec, loratadine/Claritin daily to further help with allergy symptoms Follow-up COPD symptoms not improving or worsening

## 2020-06-26 NOTE — ED Provider Notes (Signed)
[name]    CSN: [id number] Arrival date & time: 06/26/20  0846      History   Chief Complaint Chief Complaint  Patient presents with  . Medication Refill    HPI Cindy Hicks is a 60 y.o. female history of COPD presenting today for evaluation of bronchitis.  Reports feeling her COPD beginning to flare and requesting inhaler.  Reports some chest tightness but denies any cough, fevers or increased shortness of breath.  Reports symptoms often flare around allergy/pollen season.  Reports previously being on Symbicort and albuterol.  Needs refills of these.  HPI  Past Medical History:  Diagnosis Date  . Bronchitis     Patient Active Problem List   Diagnosis Date Noted  . COPD exacerbation ([place]) 02/02/2019  . Acute respiratory failure with hypoxia ([place]) 02/01/2019  . Tobacco use 02/01/2019  . Trimalleolar fracture of ankle, closed 09/18/2015    Past Surgical History:  Procedure Laterality Date  . ANKLE SURGERY Left 2000   fractured ankle, Dr. Jones Bales. [facility]  . CYST EXCISION  2015   cervical, Dr. Georgianne Fick  . FRACTURE SURGERY    . ORIF ANKLE FRACTURE Right 09/18/2015   Procedure: OPEN REDUCTION INTERNAL FIXATION (ORIF) ANKLE FRACTURE;  Surgeon: Thornton Park, MD;  Location: [facility] ORS;  Service: Orthopedics;  Laterality: Right;    OB History   No obstetric history on file.      Home Medications    Prior to Admission medications   Medication Sig Start Date End Date Taking? Authorizing Provider  budesonide-formoterol (SYMBICORT) 160-4.5 MCG/ACT inhaler Inhale 2 puffs into the lungs in the morning and at bedtime. 06/26/20  Yes Luanna Weesner C, PA-C  albuterol (VENTOLIN HFA) 108 (90 Base) MCG/ACT inhaler Inhale 2 puffs into the lungs every 4 (four) hours as needed. 06/26/20   Hilmer Aliberti C, PA-C  ibuprofen (ADVIL) 200 MG tablet Take 400 mg by mouth every 6 (six) hours as needed for moderate pain.    [provider]    Family  History History reviewed. No pertinent family history.  Social History Social History   Tobacco Use  . Smoking status: Current Some Day Smoker    Packs/day: 0.25    Types: Cigarettes  . Smokeless tobacco: Never Used  Substance Use Topics  . Alcohol use: Yes    Comment: occ  . Drug use: No     Allergies   Hydrocodone and Oxycodone   Review of Systems Review of Systems  Constitutional: Negative for activity change, appetite change, chills, fatigue and fever.  HENT: Negative for congestion, ear pain, rhinorrhea, sinus pressure, sore throat and trouble swallowing.   Eyes: Negative for discharge and redness.  Respiratory: Positive for chest tightness. Negative for cough and shortness of breath.   Cardiovascular: Negative for chest pain.  Gastrointestinal: Negative for abdominal pain, diarrhea, nausea and vomiting.  Musculoskeletal: Negative for myalgias.  Skin: Negative for rash.  Neurological: Negative for dizziness, light-headedness and headaches.     Physical Exam Triage Vital Signs ED Triage Vitals  Enc Vitals Group     BP 06/26/20 0855 (!) 152/88     Pulse Rate 06/26/20 0855 79     Resp 06/26/20 0855 18     Temp 06/26/20 0855 98.2 F (36.8 C)     Temp Source 06/26/20 0855 Oral     SpO2 06/26/20 0855 98 %     Weight --      Height --  Head Circumference --      Peak Flow --      Pain Score 06/26/20 0905 0     Pain Loc --      Pain Edu? --      Excl. in [place]? --    No data found.  Updated Vital Signs BP (!) 152/88 (BP Location: Left Arm)   Pulse 79   Temp 98.2 F (36.8 C) (Oral)   Resp 18   SpO2 98%   Visual Acuity Right Eye Distance:   Left Eye Distance:   Bilateral Distance:    Right Eye Near:   Left Eye Near:    Bilateral Near:     Physical Exam Vitals and nursing note reviewed.  Constitutional:      Appearance: She is well-developed.     Comments: No acute distress  HENT:     Head: Normocephalic and atraumatic.     Ears:      Comments: Bilateral ears without tenderness to palpation of external auricle, tragus and mastoid, EAC's without erythema or swelling, TM's with good bony landmarks and cone of light. Non erythematous.     Nose: Nose normal.     Mouth/Throat:     Comments: Oral mucosa pink and moist, no tonsillar enlargement or exudate. Posterior pharynx patent and nonerythematous, no uvula deviation or swelling. Normal phonation. Eyes:     Conjunctiva/sclera: Conjunctivae normal.  Cardiovascular:     Rate and Rhythm: Normal rate.  Pulmonary:     Effort: Pulmonary effort is normal. No respiratory distress.     Comments: Breathing comfortably at rest, CTABL, no wheezing, rales or other adventitious sounds auscultated Abdominal:     General: There is no distension.  Musculoskeletal:        General: Normal range of motion.     Cervical back: Neck supple.  Skin:    General: Skin is warm and dry.  Neurological:     Mental Status: She is alert and oriented to person, place, and time.      UC Treatments / Results  Labs (all labs ordered are listed, but only abnormal results are displayed) Labs Reviewed - No data to display  EKG   Radiology No results found.  Procedures Procedures (including critical care time)  Medications Ordered in UC Medications - No data to display  Initial Impression / Assessment and Plan / UC Course  I have reviewed the triage vital signs and the nursing notes.  Pertinent labs & imaging results that were available during my care of the patient were reviewed by me and considered in my medical decision making (see chart for details).     Medication refill for COPD-refilled albuterol and Symbicort, lungs currently clear and minimally symptomatic, deferring oral steroids/antibiotics at this time.  Encourage patient to continue to monitor symptoms, follow-up if not improving or worsening  Discussed strict return precautions. Patient verbalized understanding and is  agreeable with plan.  Final Clinical Impressions(s) / UC Diagnoses   Final diagnoses:  Chronic bronchitis, unspecified chronic bronchitis type (HCC)  Medication refill     Discharge Instructions     Symbicort and albuterol inhalers refilled Continue albuterol inhaler as needed, Symbicort twice daily May start daily cetirizine/Zyrtec, loratadine/Claritin daily to further help with allergy symptoms Follow-up COPD symptoms not improving or worsening    ED Prescriptions    Medication Sig Dispense Auth. Provider   albuterol (VENTOLIN HFA) 108 (90 Base) MCG/ACT inhaler Inhale 2 puffs into the lungs every 4 (four) hours as  needed. 18 g Sung Renton C, PA-C   budesonide-formoterol (SYMBICORT) 160-4.5 MCG/ACT inhaler Inhale 2 puffs into the lungs in the morning and at bedtime. 1 each Geraldyne Barraclough, Elesa Hacker, PA-C     PDMP not reviewed this encounter.   Janith Lima, [place] 06/26/20 [phone]

## 2020-12-22 IMAGING — DX DG CHEST 1V PORT
1 series · 1 of 1 positions shown · non-contrast
Comparison: None

CLINICAL DATA: 58-year-old female with shortness of breath.

EXAM:
PORTABLE CHEST 1 VIEW

[chest ap]
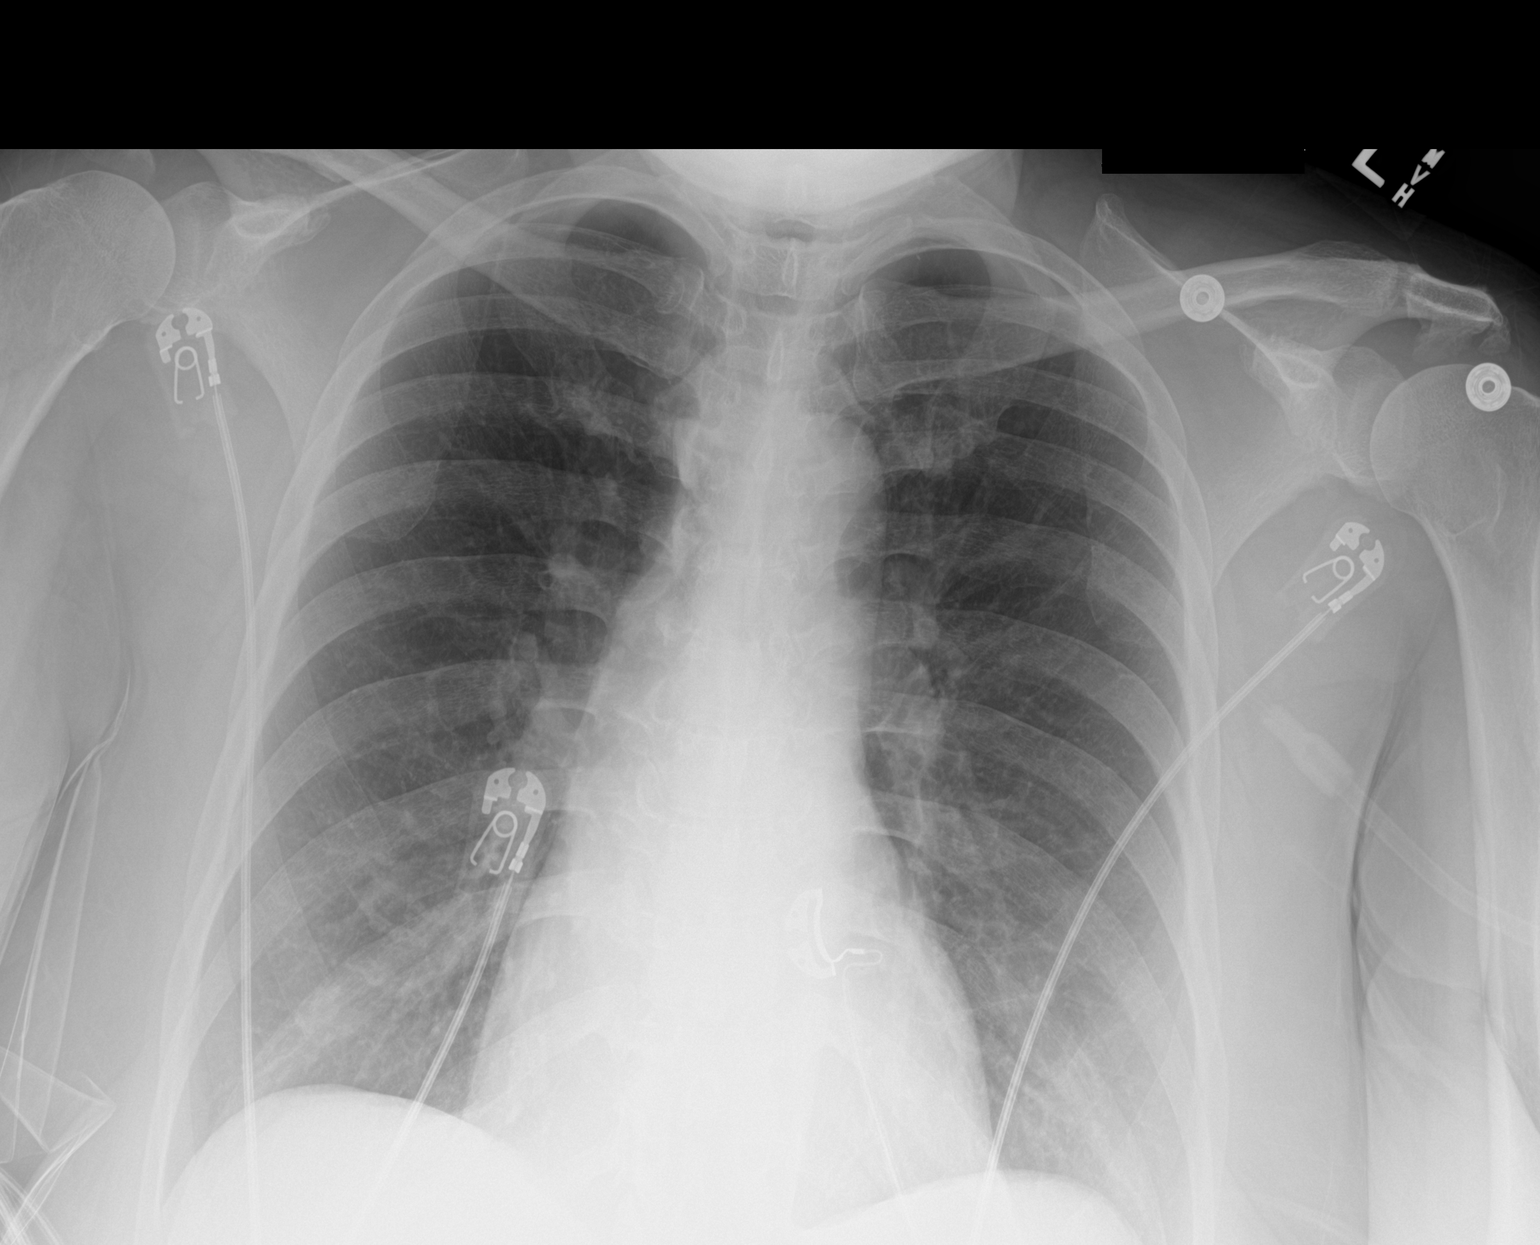

[1 of 1 positions shown; findings below may reference images not displayed]

FINDINGS: No focal consolidation, pleural effusion, or pneumothorax. The
cardiac silhouette is within normal limits. No acute osseous
pathology.
IMPRESSION: No active disease.

## 2021-08-14 ENCOUNTER — Encounter: Payer: Self-pay | Admitting: Ophthalmology

## 2021-08-14 NOTE — Discharge Instructions (Signed)

## 2021-08-19 ENCOUNTER — Encounter
Admission: RE | Disposition: A | Discharge status: Home / Self Care | Payer: Self-pay | Source: Home / Self Care | Attending: Ophthalmology

## 2021-08-19 ENCOUNTER — Ambulatory Visit: Payer: 59 | Admitting: Anesthesiology

## 2021-08-19 ENCOUNTER — Ambulatory Visit
Admission: RE | Admit: 2021-08-19 | Discharge: 2021-08-19 | Disposition: A | Discharge status: Home / Self Care | Payer: 59 | Attending: Ophthalmology | Admitting: Ophthalmology

## 2021-08-19 ENCOUNTER — Other Ambulatory Visit: Payer: Self-pay

## 2021-08-19 ENCOUNTER — Encounter: Payer: Self-pay | Admitting: Ophthalmology

## 2021-08-19 DIAGNOSIS — H2512 Age-related nuclear cataract, left eye: Secondary | ICD-10-CM | POA: Insufficient documentation

## 2021-08-19 DIAGNOSIS — E669 Obesity, unspecified: Secondary | ICD-10-CM | POA: Insufficient documentation

## 2021-08-19 DIAGNOSIS — J449 Chronic obstructive pulmonary disease, unspecified: Secondary | ICD-10-CM | POA: Diagnosis not present

## 2021-08-19 DIAGNOSIS — F17200 Nicotine dependence, unspecified, uncomplicated: Secondary | ICD-10-CM | POA: Diagnosis not present

## 2021-08-19 DIAGNOSIS — Z6837 Body mass index (BMI) 37.0-37.9, adult: Secondary | ICD-10-CM | POA: Diagnosis not present

## 2021-08-19 HISTORY — PX: CATARACT EXTRACTION W/PHACO: SHX586

## 2021-08-19 LAB — POCT PREGNANCY, URINE: Preg Test, Ur: NEGATIVE

## 2021-08-19 SURGERY — PHACOEMULSIFICATION, CATARACT, WITH IOL INSERTION
Anesthesia: Monitor Anesthesia Care | Site: Eye | Laterality: Left

## 2021-08-19 MED ORDER — CYCLOPENTOLATE HCL 2 % OP SOLN
1.0000 [drp] | OPHTHALMIC | Status: AC | PRN
  Administered 2021-08-19 (×3): 1 [drp] via OPHTHALMIC

## 2021-08-19 MED ORDER — ONDANSETRON HCL 4 MG/2ML IJ SOLN: 4.0000 mg | Freq: Once | INTRAMUSCULAR | Status: DC | PRN

## 2021-08-19 MED ORDER — SIGHTPATH DOSE#1 BSS IO SOLN
INTRAOCULAR | Status: DC | PRN
  Administered 2021-08-19: 2 mL

## 2021-08-19 MED ORDER — FENTANYL CITRATE (PF) 100 MCG/2ML IJ SOLN
INTRAMUSCULAR | Status: DC | PRN
  Administered 2021-08-19: 50 ug via INTRAVENOUS

## 2021-08-19 MED ORDER — ACETAMINOPHEN 325 MG PO TABS: 325.0000 mg | ORAL_TABLET | ORAL | Status: DC | PRN

## 2021-08-19 MED ORDER — PHENYLEPHRINE HCL 10 % OP SOLN
1.0000 [drp] | OPHTHALMIC | Status: AC | PRN
  Administered 2021-08-19 (×3): 1 [drp] via OPHTHALMIC

## 2021-08-19 MED ORDER — SIGHTPATH DOSE#1 BSS IO SOLN
INTRAOCULAR | Status: DC | PRN
  Administered 2021-08-19: 15 mL via INTRAOCULAR

## 2021-08-19 MED ORDER — ACETAMINOPHEN 160 MG/5ML PO SOLN: 325.0000 mg | ORAL | Status: DC | PRN

## 2021-08-19 MED ORDER — SIGHTPATH DOSE#1 BSS IO SOLN
INTRAOCULAR | Status: DC | PRN
  Administered 2021-08-19: 80 mL via OPHTHALMIC

## 2021-08-19 MED ORDER — MIDAZOLAM HCL 2 MG/2ML IJ SOLN
INTRAMUSCULAR | Status: DC | PRN
  Administered 2021-08-19: 2 mg via INTRAVENOUS

## 2021-08-19 MED ORDER — BRIMONIDINE TARTRATE-TIMOLOL 0.2-0.5 % OP SOLN
OPHTHALMIC | Status: DC | PRN
Start: 2021-08-19 — End: 2021-08-19
  Administered 2021-08-19: 1 [drp] via OPHTHALMIC

## 2021-08-19 MED ORDER — MOXIFLOXACIN HCL 0.5 % OP SOLN
OPHTHALMIC | Status: DC | PRN
Start: 2021-08-19 — End: 2021-08-19
  Administered 2021-08-19: 0.2 mL via OPHTHALMIC

## 2021-08-19 MED ORDER — SIGHTPATH DOSE#1 NA CHONDROIT SULF-NA HYALURON 40-17 MG/ML IO SOLN
INTRAOCULAR | Status: DC | PRN
Start: 2021-08-19 — End: 2021-08-19
  Administered 2021-08-19: 1 mL via INTRAOCULAR

## 2021-08-19 MED ORDER — TETRACAINE HCL 0.5 % OP SOLN
1.0000 [drp] | OPHTHALMIC | Status: DC | PRN
  Administered 2021-08-19 (×3): 1 [drp] via OPHTHALMIC

## 2021-08-19 SURGICAL SUPPLY — 12 items
CANNULA ANT/CHMB 27G (MISCELLANEOUS) IMPLANT
CANNULA ANT/CHMB 27GA (MISCELLANEOUS) IMPLANT
CATARACT SUITE SIGHTPATH (MISCELLANEOUS) ×2 IMPLANT
FEE CATARACT SUITE SIGHTPATH (MISCELLANEOUS) ×1 IMPLANT
GLOVE SURG ENC TEXT LTX SZ8 (GLOVE) ×2 IMPLANT
GLOVE SURG TRIUMPH 8.0 PF LTX (GLOVE) ×2 IMPLANT
LENS IOL TECNIS EYHANCE 21.0 (Intraocular Lens) ×1 IMPLANT
NDL FILTER BLUNT 18X1 1/2 (NEEDLE) ×1 IMPLANT
NEEDLE FILTER BLUNT 18X 1/2SAF (NEEDLE) ×1
NEEDLE FILTER BLUNT 18X1 1/2 (NEEDLE) ×1 IMPLANT
SYR 3ML LL SCALE MARK (SYRINGE) ×2 IMPLANT
WATER STERILE IRR 250ML POUR (IV SOLUTION) ×2 IMPLANT

## 2021-08-19 NOTE — H&P (Signed)
[place]  ? ?Primary Care Physician:  Pcp, No ?Ophthalmologist: Dr. George Ina ? ?Pre-Procedure History & Physical: ?HPI:  Cindy Hicks is a 61 y.o. female here for cataract surgery. ?  ?Past Medical History:  ?Diagnosis Date  ? Bronchitis   ? ? ?Past Surgical History:  ?Procedure Laterality Date  ? ANKLE SURGERY Left 2000  ? fractured ankle, Dr. Jones Bales [facility]  ? CYST EXCISION  2015  ? cervical, Dr. Georgianne Fick  ? FRACTURE SURGERY    ? ORIF ANKLE FRACTURE Right 09/18/2015  ? Procedure: OPEN REDUCTION INTERNAL FIXATION (ORIF) ANKLE FRACTURE;  Surgeon: Thornton Park, MD;  Location: [facility] ORS;  Service: Orthopedics;  Laterality: Right;  ? ? ?Prior to Admission medications   ?Medication Sig Start Date End Date Taking? Authorizing Provider  ?ibuprofen (ADVIL) 200 MG tablet Take 400 mg by mouth every 6 (six) hours as needed for moderate pain.   Yes [provider]  ? ? ?Allergies as of 08/11/2021 - Review Complete 06/26/2020  ?Allergen Reaction Noted  ? Hydrocodone Itching 02/11/2016  ? Oxycodone Itching 09/17/2015  ? ? ?History reviewed. No pertinent family history. ? ?Social History  ? ?Socioeconomic History  ? Marital status: Single  ?  Spouse name: Not on file  ? Number of children: Not on file  ? Years of education: Not on file  ? Highest education level: Not on file  ?Occupational History  ? Not on file  ?Tobacco Use  ? Smoking status: Some Days  ?  Packs/day: 0.25  ?  Types: Cigarettes, E-cigarettes  ?  Last attempt to quit: 04/2019  ?  Years since quitting: 2.3  ? Smokeless tobacco: Never  ? Tobacco comments:  ?  Switched from smoking to vaping 2021  ?Vaping Use  ? Vaping Use: Every day  ? Substances: Flavoring  ?Substance and Sexual Activity  ? Alcohol use: Yes  ?  Alcohol/week: 7.0 standard drinks  ?  Types: 7 Glasses of wine per week  ?  Comment: occ  ? Drug use: No  ? Sexual activity: Yes  ?Other Topics Concern  ? Not on file  ?Social History Narrative  ? Not on file  ? ?Social  Determinants of Health  ? ?Financial Resource Strain: Not on file  ?Food Insecurity: Not on file  ?Transportation Needs: Not on file  ?Physical Activity: Not on file  ?Stress: Not on file  ?Social Connections: Not on file  ?Intimate Partner Violence: Not on file  ? ? ?Review of Systems: ?See HPI, otherwise negative ROS ? ?Physical Exam: ?BP (!) 146/86   Pulse 79   Temp 97.6 ?F (36.4 ?C) (Temporal)   Ht '5\' 3"'$  (1.6 m)   Wt 95.8 kg   LMP 08/04/2021 (Approximate) Comment: irregular spotting - possibly due to fibroids (per pt)  SpO2 100%   BMI 37.41 kg/m?  ?General:   Alert, cooperative in NAD ?Head:  Normocephalic and atraumatic. ?Respiratory:  Normal work of breathing. ?Cardiovascular:  RRR ? ?Impression/Plan: ?Cindy Hicks is here for cataract surgery. ? ?Risks, benefits, limitations, and alternatives regarding cataract surgery have been reviewed with the patient.  Questions have been answered.  All parties agreeable. ? ? ?Birder Robson, MD  08/19/2021, 11:37 AM ? ? ?

## 2021-08-19 NOTE — Anesthesia Preprocedure Evaluation (Addendum)
Anesthesia Evaluation  ?Patient identified by MRN, date of birth, ID band ?Patient awake ? ? ? ?Reviewed: ?Allergy & Precautions, NPO status  ? ?Airway ?Mallampati: II ? ?TM Distance: >3 FB ? ? ? ? Dental ?  ?Pulmonary ?COPD, Current Smoker and Patient abstained from smoking.,  ?  ?Pulmonary exam normal ? ? ? ? ? ? ? Cardiovascular ?negative cardio ROS ? ? ?Rhythm:Regular Rate:Normal ? ? ?  ?Neuro/Psych ?  ? GI/Hepatic ?negative GI ROS,   ?Endo/Other  ?Obesity - BMI > 30 ? Renal/GU ?  ? ?  ?Musculoskeletal ? ? Abdominal ?  ?Peds ? Hematology ?  ?Anesthesia Other Findings ? ? Reproductive/Obstetrics ? ?  ? ? ? ? ? ? ? ? ? ? ? ? ? ?  ?  ? ? ? ? ? ? ? ?Anesthesia Physical ?Anesthesia Plan ? ?ASA: 2 ? ?Anesthesia Plan: MAC  ? ?Post-op Pain Management: Minimal or no pain anticipated  ? ?Induction: Intravenous ? ?PONV Risk Score and Plan: 2 and TIVA, Midazolam and Treatment may vary due to age or medical condition ? ?Airway Management Planned: Natural Airway and Nasal Cannula ? ?Additional Equipment:  ? ?Intra-op Plan:  ? ?Post-operative Plan:  ? ?Informed Consent: I have reviewed the patients History and Physical, chart, labs and discussed the procedure including the risks, benefits and alternatives for the proposed anesthesia with the patient or authorized representative who has indicated his/her understanding and acceptance.  ? ? ? ? ? ?Plan Discussed with: CRNA ? ?Anesthesia Plan Comments:   ? ? ? ? ? ?Anesthesia Quick Evaluation ? ?

## 2021-08-19 NOTE — Anesthesia Postprocedure Evaluation (Signed)
Anesthesia Post Note ? ?Patient: Cindy Hicks ? ?Procedure(s) Performed: CATARACT EXTRACTION PHACO AND INTRAOCULAR LENS PLACEMENT (IOC) LEFT  19.67 01:40.6 (Left: Eye) ? ? ?  ?Patient location during evaluation: PACU ?Anesthesia Type: MAC ?Level of consciousness: awake ?Pain management: pain level controlled ?Vital Signs Assessment: post-procedure vital signs reviewed and stable ?Respiratory status: respiratory function stable ?Cardiovascular status: stable ?Postop Assessment: no apparent nausea or vomiting ?Anesthetic complications: no ? ? ?No notable events documented. ? ?Veda Canning ? ? ? ? ? ?

## 2021-08-19 NOTE — Transfer of Care (Signed)
Immediate Anesthesia Transfer of Care Note ? ?Patient: Cindy Hicks ? ?Procedure(s) Performed: CATARACT EXTRACTION PHACO AND INTRAOCULAR LENS PLACEMENT (IOC) LEFT  19.67 01:40.6 (Left: Eye) ? ?Patient Location: PACU ? ?Anesthesia Type: MAC ? ?Level of Consciousness: awake, alert  and patient cooperative ? ?Airway and Oxygen Therapy: Patient Spontanous Breathing and Patient connected to supplemental oxygen ? ?Post-op Assessment: Post-op Vital signs reviewed, Patient's Cardiovascular Status Stable, Respiratory Function Stable, Patent Airway and No signs of Nausea or vomiting ? ?Post-op Vital Signs: Reviewed and stable ? ?Complications: No notable events documented. ? ?

## 2021-08-19 NOTE — Op Note (Signed)
PREOPERATIVE DIAGNOSIS:  Nuclear sclerotic cataract of the left eye. ?  ?POSTOPERATIVE DIAGNOSIS:  Nuclear sclerotic cataract of the left eye. ?  ?OPERATIVE PROCEDURE:ORPROCALL@ ?  ?SURGEON:  Birder Robson, MD. ?  ?ANESTHESIA: ? ?Anesthesiologist: Veda Canning, MD ?CRNA: Silvana Newness, CRNA ? ?1.      Managed anesthesia care. ?2.     0.28m of Shugarcaine was instilled following the paracentesis ?  ?COMPLICATIONS:  None. ?  ?TECHNIQUE:   Stop and chop ?  ?DESCRIPTION OF PROCEDURE:  The patient was examined and consented in the preoperative holding area where the aforementioned topical anesthesia was applied to the left eye and then brought back to the Operating Room where the left eye was prepped and draped in the usual sterile ophthalmic fashion and a lid speculum was placed. A paracentesis was created with the side port blade and the anterior chamber was filled with viscoelastic. A near clear corneal incision was performed with the steel keratome. A continuous curvilinear capsulorrhexis was performed with a cystotome followed by the capsulorrhexis forceps. Hydrodissection and hydrodelineation were carried out with BSS on a blunt cannula. The lens was removed in a stop and chop  technique and the remaining cortical material was removed with the irrigation-aspiration handpiece. The capsular bag was inflated with viscoelastic and the Technis [id number] lens was placed in the capsular bag without complication. The remaining viscoelastic was removed from the eye with the irrigation-aspiration handpiece. The wounds were hydrated. The anterior chamber was flushed with BSS and the eye was inflated to physiologic pressure. 0.168mVigamox was placed in the anterior chamber. The wounds were found to be water tight. The eye was dressed with Combigan. The patient was given protective glasses to wear throughout the day and a shield with which to sleep tonight. The patient was also given drops with which to begin a drop regimen  today and will follow-up with me in one day. ?Implant Name Type Inv. Item Serial No. Manufacturer Lot No. LRB No. Used Action  ?LENS IOL TECNIS EYHANCE 21.0 - [id number] Lens LENS IOL TECNIS EYHANCE 21.0 [id number]  Left 1 Implanted  ?  ?Procedure(s): ?CATARACT EXTRACTION PHACO AND INTRAOCULAR LENS PLACEMENT (IOC) LEFT  19.67 01:40.6 (Left) ? ?Electronically signed: WiBirder Robson/16/2023 12:05 PM ? ?

## 2022-03-12 ENCOUNTER — Ambulatory Visit (INDEPENDENT_AMBULATORY_CARE_PROVIDER_SITE_OTHER): Payer: 59 | Admitting: Obstetrics and Gynecology

## 2022-03-12 ENCOUNTER — Encounter: Payer: Self-pay | Admitting: Obstetrics and Gynecology

## 2022-03-12 VITALS — BP 154/90 | HR 71 | Ht 63.0 in | Wt 217.6 lb

## 2022-03-12 DIAGNOSIS — N951 Menopausal and female climacteric states: Secondary | ICD-10-CM | POA: Diagnosis not present

## 2022-03-12 DIAGNOSIS — N939 Abnormal uterine and vaginal bleeding, unspecified: Secondary | ICD-10-CM

## 2022-03-12 DIAGNOSIS — D219 Benign neoplasm of connective and other soft tissue, unspecified: Secondary | ICD-10-CM

## 2022-03-12 DIAGNOSIS — Z7689 Persons encountering health services in other specified circumstances: Secondary | ICD-10-CM

## 2022-03-12 NOTE — Progress Notes (Signed)
HPI:      Ms. Cindy Hicks is a 61 y.o. G0P0000 who LMP was No LMP recorded. Patient is perimenopausal.  Subjective:   She presents today because she is having symptoms of menopause and has spotting for approximately 5 days every month.  She remains sexually active but reports that the spotting is not related to intercourse. She has a history of uterine fibroids, 1 of which prolapsed through the cervix and was removed.  She is also somewhat delinquent in her annual exams and Pap smears.    Hx: The following portions of the patient's history were reviewed and updated as appropriate:             She  has a past medical history of Bronchitis. She does not have any pertinent problems on file. She  has a past surgical history that includes Ankle surgery (Left, 2000); Fracture surgery; Cyst excision (2015); ORIF ankle fracture (Right, 09/18/2015); and Cataract extraction w/PHACO (Left, 08/19/2021). Her family history is not on file. She  reports that she has been smoking cigarettes and e-cigarettes. She has been smoking an average of .25 packs per day. She has never used smokeless tobacco. She reports current alcohol use of about 7.0 standard drinks of alcohol per week. She reports that she does not use drugs. She has a current medication list which includes the following prescription(s): ibuprofen. She is allergic to hydrocodone and oxycodone.       Review of Systems:  Review of Systems  Constitutional: Denied constitutional symptoms, night sweats, recent illness, fatigue, fever, insomnia and weight loss.  Eyes: Denied eye symptoms, eye pain, photophobia, vision change and visual disturbance.  Ears/Nose/Throat/Neck: Denied ear, nose, throat or neck symptoms, hearing loss, nasal discharge, sinus congestion and sore throat.  Cardiovascular: Denied cardiovascular symptoms, arrhythmia, chest pain/pressure, edema, exercise intolerance, orthopnea and palpitations.  Respiratory: Denied pulmonary  symptoms, asthma, pleuritic pain, productive sputum, cough, dyspnea and wheezing.  Gastrointestinal: Denied, gastro-esophageal reflux, melena, nausea and vomiting.  Genitourinary: See HPI for additional information.  Musculoskeletal: Denied musculoskeletal symptoms, stiffness, swelling, muscle weakness and myalgia.  Dermatologic: Denied dermatology symptoms, rash and scar.  Neurologic: Denied neurology symptoms, dizziness, headache, neck pain and syncope.  Psychiatric: Denied psychiatric symptoms, anxiety and depression.  Endocrine: Denied endocrine symptoms including hot flashes and night sweats.   Meds:   Current Outpatient Medications on File Prior to Visit  Medication Sig Dispense Refill   ibuprofen (ADVIL) 200 MG tablet Take 400 mg by mouth every 6 (six) hours as needed for moderate pain.     No current facility-administered medications on file prior to visit.      Objective:     Vitals:   03/12/22 1134  BP: (!) 154/90  Pulse: 71   Filed Weights   03/12/22 1134  Weight: 217 lb 9.6 oz (98.7 kg)                        Assessment:    G0P0000 Patient Active Problem List   Diagnosis Date Noted   COPD exacerbation ([place]) 02/02/2019   Acute respiratory failure with hypoxia ([place]) 02/01/2019   Tobacco use 02/01/2019   Trimalleolar fracture of ankle, closed 09/18/2015     1. Establishing care with new doctor, encounter for   2. Vaginal bleeding   3. Menopausal symptoms   4. Fibroids     Possible postmenopausal bleeding based on age.  History of uterine fibroids   Plan:  1.  Ultrasound for fibroids and endometrial lining  2.  [place] to determine menopausal status  3.  Possible future endometrial biopsy based on results.  4.  Patient to schedule for annual examination in the near future. Orders Orders Placed This Encounter  Procedures   US PELVIS TRANSVAGINAL NON-OB (TV ONLY)   US PELVIS (TRANSABDOMINAL ONLY)   [place]    No orders of the defined types  were placed in this encounter.     F/U  Return in about 4 weeks (around 04/09/2022) for We will contact her with any abnormal test results. I spent 31 minutes involved in the care of this patient preparing to see the patient by obtaining and reviewing her medical history (including labs, imaging tests and prior procedures), documenting clinical information in the electronic health record (EHR), counseling and coordinating care plans, writing and sending prescriptions, ordering tests or procedures and in direct communicating with the patient and medical staff discussing pertinent items from her history and physical exam.  Finis Bud, M.D. 03/12/2022 11:55 AM

## 2022-03-12 NOTE — Progress Notes (Signed)
Patient presents today for vaginal bleeding. She states over the last 1-2 years monthly spotting that is very light. Patient also reports hot flashes.

## 2022-03-13 LAB — FOLLICLE STIMULATING HORMONE: FSH: 52.5 m[IU]/mL

## 2022-03-15 NOTE — Progress Notes (Signed)
This is consistent with menopause.  Please let her know  thanks

## 2022-03-16 ENCOUNTER — Telehealth: Payer: Self-pay | Admitting: Obstetrics and Gynecology

## 2022-03-16 NOTE — Telephone Encounter (Signed)
Patient called and received centralized scheduling number to call to schduled ultrasound. Patient is scheduled for annual with [facility] on 04/28/22

## 2022-03-16 NOTE — Telephone Encounter (Signed)
-----   Message from Marykay Lex, [place] sent at 03/16/2022  8:25 AM EST ----- Regarding: ultrasound needed Please call patient and schedule ultrasound. Patient also needs an annual exam scheduled.   Thank you

## 2022-03-16 NOTE — Telephone Encounter (Signed)
I contacted patient via phone. Patient states she is unable to take down information to call to scheduled ultrasound, patient states she will call back to get centralized scheduling number, I advised patient I could send info thru mychart. Patient states she it's using that  just yet.

## 2022-03-23 ENCOUNTER — Ambulatory Visit
Admission: RE | Admit: 2022-03-23 | Discharge: 2022-03-23 | Disposition: A | Discharge status: Home / Self Care | Payer: 59 | Source: Ambulatory Visit | Attending: Obstetrics and Gynecology | Admitting: Obstetrics and Gynecology

## 2022-03-23 DIAGNOSIS — N939 Abnormal uterine and vaginal bleeding, unspecified: Secondary | ICD-10-CM | POA: Diagnosis present

## 2022-03-25 ENCOUNTER — Telehealth: Payer: Self-pay

## 2022-04-14 ENCOUNTER — Other Ambulatory Visit: Payer: Self-pay | Admitting: Family Medicine

## 2022-04-14 DIAGNOSIS — Z1231 Encounter for screening mammogram for malignant neoplasm of breast: Secondary | ICD-10-CM

## 2022-04-16 ENCOUNTER — Other Ambulatory Visit: Payer: Self-pay

## 2022-04-16 ENCOUNTER — Telehealth: Payer: Self-pay

## 2022-04-16 DIAGNOSIS — Z1211 Encounter for screening for malignant neoplasm of colon: Secondary | ICD-10-CM

## 2022-04-16 MED ORDER — NA SULFATE-K SULFATE-MG SULF 17.5-3.13-1.6 GM/177ML PO SOLN: 354.0000 mL | Freq: Once | ORAL | 0 refills | Status: AC

## 2022-04-16 NOTE — Telephone Encounter (Signed)
Gastroenterology Pre-Procedure Review  Request Date: 05/12/2022 Requesting Physician: Dr. Marius Ditch   PATIENT REVIEW QUESTIONS: The patient responded to the following health history questions as indicated:    1. Are you having any GI issues? no 2. Do you have a personal history of Polyps? no 3. Do you have a family history of Colon Cancer or Polyps? no 4. Diabetes Mellitus? no 5. Joint replacements in the past 12 months?no 6. Major health problems in the past 3 months?no 7. Any artificial heart valves, MVP, or defibrillator?no    MEDICATIONS & ALLERGIES:    Patient reports the following regarding taking any anticoagulation/antiplatelet therapy:   Plavix, Coumadin, Eliquis, Xarelto, Lovenox, Pradaxa, Brilinta, or Effient? no Aspirin? no  Patient confirms/reports the following medications:  Current Outpatient Medications  Medication Sig Dispense Refill   ibuprofen (ADVIL) 200 MG tablet Take 400 mg by mouth every 6 (six) hours as needed for moderate pain.     No current facility-administered medications for this visit.    Patient confirms/reports the following allergies:  Allergies  Allergen Reactions   Hydrocodone Itching   Oxycodone Itching    "severe"    No orders of the defined types were placed in this encounter.   AUTHORIZATION INFORMATION Primary Insurance: 1D#: Group #:  Secondary Insurance: 1D#: Group #:  SCHEDULE INFORMATION: Date:  Time: Location:

## 2022-04-28 ENCOUNTER — Ambulatory Visit: Payer: 59 | Admitting: Obstetrics and Gynecology

## 2022-05-11 ENCOUNTER — Encounter: Payer: Self-pay | Admitting: Gastroenterology

## 2022-05-12 ENCOUNTER — Encounter: Payer: Self-pay | Admitting: Certified Registered"

## 2022-05-12 ENCOUNTER — Encounter: Admission: RE | Payer: Self-pay | Source: Home / Self Care

## 2022-05-12 ENCOUNTER — Ambulatory Visit: Admission: RE | Admit: 2022-05-12 | Payer: 59 | Source: Home / Self Care | Admitting: Gastroenterology

## 2022-05-12 SURGERY — COLONOSCOPY WITH PROPOFOL
Anesthesia: General

## 2022-11-25 ENCOUNTER — Other Ambulatory Visit: Payer: Self-pay | Admitting: Family Medicine

## 2022-11-25 DIAGNOSIS — Z1231 Encounter for screening mammogram for malignant neoplasm of breast: Secondary | ICD-10-CM

## 2022-12-23 ENCOUNTER — Emergency Department
Admission: EM | Admit: 2022-12-23 | Discharge: 2022-12-23 | Discharge status: Against Medical Advice | Payer: 59 | Attending: Emergency Medicine | Admitting: Emergency Medicine

## 2022-12-23 DIAGNOSIS — W1840XA Slipping, tripping and stumbling without falling, unspecified, initial encounter: Secondary | ICD-10-CM | POA: Insufficient documentation

## 2022-12-23 DIAGNOSIS — Y990 Civilian activity done for income or pay: Secondary | ICD-10-CM | POA: Insufficient documentation

## 2022-12-23 DIAGNOSIS — M545 Low back pain, unspecified: Secondary | ICD-10-CM | POA: Insufficient documentation

## 2022-12-23 DIAGNOSIS — Z5321 Procedure and treatment not carried out due to patient leaving prior to being seen by health care provider: Secondary | ICD-10-CM | POA: Insufficient documentation

## 2022-12-23 NOTE — ED Notes (Signed)
Pt not found in room or surrounding areas. RN notified provider.

## 2022-12-23 NOTE — ED Triage Notes (Signed)
Pt c/o lower R sided back pain radiating down into her leg that started yesterday when she slipped in the bathroom at work. Denies any fall or other injury. Pt took tylenol at 1200

## 2023-02-24 ENCOUNTER — Other Ambulatory Visit (HOSPITAL_COMMUNITY)
Admission: RE | Admit: 2023-02-24 | Discharge: 2023-02-24 | Disposition: A | Discharge status: Home / Self Care | Payer: 59 | Source: Ambulatory Visit | Attending: Obstetrics and Gynecology | Admitting: Obstetrics and Gynecology

## 2023-02-24 ENCOUNTER — Encounter: Payer: Self-pay | Admitting: Obstetrics and Gynecology

## 2023-02-24 ENCOUNTER — Ambulatory Visit: Payer: 59 | Admitting: Obstetrics and Gynecology

## 2023-02-24 VITALS — BP 112/74 | HR 81 | Ht 63.0 in | Wt 214.3 lb

## 2023-02-24 DIAGNOSIS — R87610 Atypical squamous cells of undetermined significance on cytologic smear of cervix (ASC-US): Secondary | ICD-10-CM

## 2023-02-24 DIAGNOSIS — R87810 Cervical high risk human papillomavirus (HPV) DNA test positive: Secondary | ICD-10-CM | POA: Insufficient documentation

## 2023-02-24 DIAGNOSIS — N870 Mild cervical dysplasia: Secondary | ICD-10-CM

## 2023-02-24 NOTE — Progress Notes (Signed)
Patient presents today for a colposcopy. She recently had an abnormal pap smear resulting in ASCUS/HPV+. No further concerns today. 

## 2023-02-24 NOTE — Progress Notes (Signed)
   HPI:  Cindy Hicks is a 62 y.o.  G0P0000  who presents today for evaluation and management of abnormal cervical cytology.    Dysplasia History: ASCUS     HPV: Positive  This is her first abnormal Pap.  ROS:  Pertinent items noted in HPI and remainder of comprehensive ROS otherwise negative.  OB History  Gravida Para Term Preterm AB Living  0 0 0 0 0 0  SAB IAB Ectopic Multiple Live Births  0 0 0 0 0    Past Medical History:  Diagnosis Date   Bronchitis     Past Surgical History:  Procedure Laterality Date   ANKLE SURGERY Left 2000   fractured ankle, Dr. Gabrielle Dare. [facility]   CATARACT EXTRACTION W/PHACO Left 08/19/2021   Procedure: CATARACT EXTRACTION PHACO AND INTRAOCULAR LENS PLACEMENT (IOC) LEFT  19.67 01:40.6;  Surgeon: Galen Manila, MD;  [facility];  Service: Ophthalmology;  Laterality: Left;   CYST EXCISION  2015   cervical, Dr. Bonney Aid   FRACTURE SURGERY     ORIF ANKLE FRACTURE Right 09/18/2015   Procedure: OPEN REDUCTION INTERNAL FIXATION (ORIF) ANKLE FRACTURE;  Surgeon: Juanell Fairly, MD;  Location: [facility];  Service: Orthopedics;  Laterality: Right;    SOCIAL HISTORY:  Social History   Substance and Sexual Activity  Alcohol Use Yes   Alcohol/week: 7.0 standard drinks of alcohol   Types: 7 Glasses of wine per week   Comment: occ    Social History   Substance and Sexual Activity  Drug Use No     History reviewed. No pertinent family history.  ALLERGIES:  Hydrocodone and Oxycodone  She has a current medication list which includes the following prescription(s): ibuprofen, NON FORMULARY, and trazodone.  Physical Exam: -Vitals:  BP 112/74   Pulse 81   Ht 5\' 3"  (1.6 m)   Wt 214 lb 4.8 oz (97.2 kg)   BMI 37.96 kg/m   PROCEDURE: Colposcopy performed with 4% acetic acid after verbal consent obtained                           -Aceto-white Lesions Location(s): See above   -no lesions noted.  T-zone visible at  margin of exocervix and endocervix              -              -ECC indicated and performed: Yes.       -Biopsy sites made hemostatic with pressure and Monsel's solution   -Satisfactory colposcopy: Yes.      -Evidence of Invasive cervical CA :  NO  ASSESSMENT:  Cindy Hicks is a 62 y.o. G0P0000 here for  1. ASCUS with positive high risk HPV cervical   .  PLAN: 1.  I discussed the grading system of pap smears and HPV high risk viral types.  We will discuss management after colpo results return.  No orders of the defined types were placed in this encounter.          F/U  Return in about 2 weeks (around 03/10/2023). Natural course and history of HPV and its relationship to cervical dysplasia and cervical dysplasia is relationship to cervical cancer discussed in detail.  All questions answered.   Brennan Bailey ,MD 02/24/2023,3:00 PM

## 2023-02-26 LAB — SURGICAL PATHOLOGY

## 2023-03-10 ENCOUNTER — Telehealth: Payer: 59 | Admitting: Obstetrics and Gynecology

## 2023-03-10 DIAGNOSIS — N870 Mild cervical dysplasia: Secondary | ICD-10-CM

## 2023-05-21 ENCOUNTER — Ambulatory Visit (INDEPENDENT_AMBULATORY_CARE_PROVIDER_SITE_OTHER): Payer: Self-pay | Admitting: Physician Assistant

## 2023-05-21 ENCOUNTER — Other Ambulatory Visit: Payer: Self-pay

## 2023-05-21 VITALS — BP 145/70 | HR 55 | Temp 98.2°F | Ht 63.0 in | Wt 215.0 lb

## 2023-05-21 DIAGNOSIS — S39012A Strain of muscle, fascia and tendon of lower back, initial encounter: Secondary | ICD-10-CM

## 2023-05-21 MED ORDER — MELOXICAM 7.5 MG PO TABS: 7.5000 mg | ORAL_TABLET | Freq: Every day | ORAL | 0 refills | Status: AC

## 2023-05-21 NOTE — Progress Notes (Signed)
 [name] [address]  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 No impact onto the ground. Reports having low back pain since then.   Saw PCP Dr. Okey Dupre at [facility] the day of the incident, was given rx for ibuprofen 800mg . Helped a little. Pain comes mostly with movement. Kept working after the injury, went back to her PCP months later, and saw Dr. Marvis Moeller, who prescribed a steroid dose pack. That helped some, but then symptoms returned after. Went back to Dr. Marvis Moeller who continued the steroids for a couple more days, which also helped, but the last dose was on 05/10/23. Since then her symptoms have returned. No hx of back pain in the past. She was referred to [facility] but hasn't started. Was told she needed to come here so the "insurance would cover the PT"   Provider Note On exam, C/T spine with FROM intact, lumbar spine with minimally decreased ROM with flexion 2/2 pain. All spinal levels without spinous process TTP, no bony stepoffs or deformities, mild b/l paraspinous muscle TTP and slight muscle spasms in lumbar spine region. Negative SLR bilaterally. No overlying skin changes. Strength and sensation grossly  intact in all extremities, gait steady and nonantalgic. Distal pulses intact.  Able to bend over to grab glasses off the floor, but did need to squat slightly to reach them.    Diagnosis    ICD-10-CM   1. Strain of lumbar region, initial encounter  S39.012A        Medications Prescribed  Meds ordered this encounter  Medications   meloxicam (MOBIC) 7.5 MG tablet    Sig: Take 1 tablet (7.5 mg total) by mouth daily.    Dispense:  30 tablet    Refill:  0    Supervising Provider:   Erasmo Downer [[id number]]    Unfortunately, this patient did not come for her initial evaluation at the time of the injury. She reports being treated with essentially conservative care since the injury date, by her PCP, but has persisting symptoms. At this point, nearly 6mos later, if she's having persisting back pain failing conservative care, then she would need referral to orthopedics for further evaluation and to decide whether PT is needed, vs imaging, vs other treatment modality. If she had come in initially, likely conservative care would have been initiated first, before referral to ortho, but it seems she's done this through her PCP already. Therefore, at this time, the next step is referral to ortho-- but simply because she's already done conservative care prior to this "initial visit" today.   Referred to [facility]    Return to Work Status- defer to ortho eval  Provider Signature ________________________________________Date_________   Employee Signature _______________________________________Date_________   Please email this completed form to Angus Seller, Director of Risk Management at [email or web address] within 24 hours of visit.

## 2023-06-20 ENCOUNTER — Other Ambulatory Visit: Payer: Self-pay | Admitting: Physician Assistant
